# Patient Record
Sex: Female | Born: 1965 | Race: White | Hispanic: No | Smoking: Former smoker
Health system: Southern US, Community
[De-identification: ages and names within clinical notes are randomized; demographics above are authoritative.]

## PROBLEM LIST (undated history)

## (undated) DIAGNOSIS — K219 Gastro-esophageal reflux disease without esophagitis: Secondary | ICD-10-CM

## (undated) DIAGNOSIS — D219 Benign neoplasm of connective and other soft tissue, unspecified: Secondary | ICD-10-CM

## (undated) DIAGNOSIS — F909 Attention-deficit hyperactivity disorder, unspecified type: Secondary | ICD-10-CM

## (undated) DIAGNOSIS — Z9889 Other specified postprocedural states: Secondary | ICD-10-CM

## (undated) DIAGNOSIS — K210 Gastro-esophageal reflux disease with esophagitis: Secondary | ICD-10-CM

## (undated) DIAGNOSIS — K5790 Diverticulosis of intestine, part unspecified, without perforation or abscess without bleeding: Secondary | ICD-10-CM

## (undated) DIAGNOSIS — R51 Headache: Secondary | ICD-10-CM

## (undated) DIAGNOSIS — E785 Hyperlipidemia, unspecified: Secondary | ICD-10-CM

## (undated) DIAGNOSIS — R112 Nausea with vomiting, unspecified: Secondary | ICD-10-CM

## (undated) DIAGNOSIS — Z86018 Personal history of other benign neoplasm: Secondary | ICD-10-CM

## (undated) DIAGNOSIS — G43909 Migraine, unspecified, not intractable, without status migrainosus: Secondary | ICD-10-CM

## (undated) DIAGNOSIS — R519 Headache, unspecified: Secondary | ICD-10-CM

## (undated) DIAGNOSIS — Z8489 Family history of other specified conditions: Secondary | ICD-10-CM

## (undated) DIAGNOSIS — K449 Diaphragmatic hernia without obstruction or gangrene: Secondary | ICD-10-CM

## (undated) DIAGNOSIS — Z973 Presence of spectacles and contact lenses: Secondary | ICD-10-CM

## (undated) HISTORY — DX: Diverticulosis of intestine, part unspecified, without perforation or abscess without bleeding: K57.90

## (undated) HISTORY — PX: UPPER GI ENDOSCOPY: SHX6162

## (undated) HISTORY — PX: COLONOSCOPY: SHX174

## (undated) HISTORY — PX: WISDOM TOOTH EXTRACTION: SHX21

## (undated) HISTORY — DX: Attention-deficit hyperactivity disorder, unspecified type: F90.9

## (undated) HISTORY — DX: Benign neoplasm of connective and other soft tissue, unspecified: D21.9

## (undated) HISTORY — DX: Gastro-esophageal reflux disease with esophagitis: K21.0

## (undated) HISTORY — PX: DENTAL SURGERY: SHX609

## (undated) HISTORY — DX: Other specified postprocedural states: R11.2

---

## 2016-10-27 DIAGNOSIS — D219 Benign neoplasm of connective and other soft tissue, unspecified: Secondary | ICD-10-CM

## 2016-10-27 DIAGNOSIS — K2100 Gastro-esophageal reflux disease with esophagitis, without bleeding: Secondary | ICD-10-CM

## 2016-10-27 DIAGNOSIS — K5790 Diverticulosis of intestine, part unspecified, without perforation or abscess without bleeding: Secondary | ICD-10-CM

## 2016-10-27 HISTORY — DX: Benign neoplasm of connective and other soft tissue, unspecified: D21.9

## 2016-10-27 HISTORY — DX: Diverticulosis of intestine, part unspecified, without perforation or abscess without bleeding: K57.90

## 2016-10-27 HISTORY — DX: Gastro-esophageal reflux disease with esophagitis, without bleeding: K21.00

## 2017-02-23 ENCOUNTER — Ambulatory Visit: Payer: Self-pay | Admitting: Adult Health

## 2017-03-11 ENCOUNTER — Encounter: Payer: Self-pay | Admitting: Adult Health

## 2017-03-11 ENCOUNTER — Ambulatory Visit (INDEPENDENT_AMBULATORY_CARE_PROVIDER_SITE_OTHER): Payer: BC Managed Care – PPO | Admitting: Adult Health

## 2017-03-11 VITALS — BP 126/80 | HR 92 | Temp 98.1°F | Ht 63.0 in | Wt 173.0 lb

## 2017-03-11 DIAGNOSIS — R41840 Attention and concentration deficit: Secondary | ICD-10-CM

## 2017-03-11 DIAGNOSIS — Z0000 Encounter for general adult medical examination without abnormal findings: Secondary | ICD-10-CM | POA: Diagnosis not present

## 2017-03-11 NOTE — Assessment & Plan Note (Signed)
Referral to psychiatry placed.

## 2017-03-11 NOTE — Progress Notes (Signed)
Subjective:    Patient ID: Rachel Woods, female    DOB: 10-25-1966, 51 y.o.   MRN: [id number]  HPI:  Rachel Woods is here to establish as a new pt.  She is a very pleasant 51 year old female.  PMH:  Childhood ADHD treated with Ritalin, last dose was 1978.  She reports poor concentration and dis-organization and would like to be evaluated for ADD/ADHD. Only other complaint is "clear watery drainage form my belly button" that occurred during her last menstrual cycle.  This has never occurred before and has completely resolved now.  She denies abdominal pain/malaise/poor appetite/unexplained wt loss.   She has not had any preventative screening measures. Overall she feels "pretty great and longevity runs in my family".  No care team member to display  Patient Active Problem List   Diagnosis Date Noted  . Health care maintenance 03/11/2017     Past Medical History:  Diagnosis Date  . ADHD      Past Surgical History:  Procedure Laterality Date  . WISDOM TOOTH EXTRACTION       Family History  Problem Relation Age of Onset  . Hypertension Mother   . Dementia Mother   . Fibroids Mother   . Heart disease Mother   . Glaucoma Father   . Diabetes Father   . Hypertension Father   . Heart disease Father      History  Drug Use No     History  Alcohol Use  . Yes     History  Smoking Status  . Current Some Day Smoker  Smokeless Tobacco  . Never Used     Outpatient Encounter Prescriptions as of 03/11/2017  Medication Sig  . Aspirin-Acetaminophen-Caffeine (EXCEDRIN PO) Take by mouth as needed.  Marland Kitchen ibuprofen (ADVIL,MOTRIN) 200 MG tablet Take 200 mg by mouth as needed.   No facility-administered encounter medications on file as of 03/11/2017.     Allergies: Patient has no known allergies.  Body mass index is 30.65 kg/m.  Blood pressure 126/80, pulse 92, temperature 98.1 F (36.7 C), height 5\' 3"  (1.6 m), weight 173 lb (78.5 kg), last menstrual period  02/27/2017, SpO2 98 %.    No care team member to display  Patient Active Problem List   Diagnosis Date Noted  . Health care maintenance 03/11/2017     Past Medical History:  Diagnosis Date  . ADHD      Past Surgical History:  Procedure Laterality Date  . WISDOM TOOTH EXTRACTION       Family History  Problem Relation Age of Onset  . Hypertension Mother   . Dementia Mother   . Fibroids Mother   . Heart disease Mother   . Glaucoma Father   . Diabetes Father   . Hypertension Father   . Heart disease Father      History  Drug Use No     History  Alcohol Use  . Yes     History  Smoking Status  . Current Some Day Smoker  Smokeless Tobacco  . Never Used     Outpatient Encounter Prescriptions as of 03/11/2017  Medication Sig  . Aspirin-Acetaminophen-Caffeine (EXCEDRIN PO) Take by mouth as needed.  Marland Kitchen ibuprofen (ADVIL,MOTRIN) 200 MG tablet Take 200 mg by mouth as needed.   No facility-administered encounter medications on file as of 03/11/2017.     Allergies: Patient has no known allergies.  Body mass index is 30.65 kg/m.  Height 5\' 3"  (1.6 m), weight 173 lb (  78.5 kg), last menstrual period 02/27/2017.    Review of Systems  Constitutional: Positive for fatigue. Negative for activity change, appetite change, chills, diaphoresis, fever and unexpected weight change.  Respiratory: Negative for cough, chest tightness, shortness of breath, wheezing and stridor.   Cardiovascular: Negative for chest pain, palpitations and leg swelling.  Gastrointestinal: Negative for abdominal distention, abdominal pain, blood in stool, constipation, diarrhea, nausea and vomiting.  Endocrine: Negative for cold intolerance, heat intolerance, polydipsia, polyphagia and polyuria.  Genitourinary: Negative for difficulty urinating, flank pain and hematuria.  Skin: Negative for color change, pallor, rash and wound.  Allergic/Immunologic: Negative for immunocompromised state.   Neurological: Negative for dizziness.  Hematological: Does not bruise/bleed easily.  Psychiatric/Behavioral: Negative for agitation.       Objective:   Physical Exam  Constitutional: She is oriented to person, place, and time. She appears well-developed and well-nourished. No distress.  HENT:  Head: Normocephalic and atraumatic.  Right Ear: Hearing, tympanic membrane, external ear and ear canal normal. No decreased hearing is noted.  Left Ear: Hearing, tympanic membrane, external ear and ear canal normal. No decreased hearing is noted.  Nose: Nose normal. Right sinus exhibits no maxillary sinus tenderness and no frontal sinus tenderness. Left sinus exhibits no maxillary sinus tenderness and no frontal sinus tenderness.  Mouth/Throat: Uvula is midline.  Eyes: Conjunctivae are normal. Pupils are equal, round, and reactive to light.  Neck: Normal range of motion. Neck supple.  Cardiovascular: Normal rate, regular rhythm, normal heart sounds and intact distal pulses.   Pulmonary/Chest: Effort normal and breath sounds normal. No respiratory distress. She has no wheezes. She has no rales. She exhibits no tenderness.  Abdominal: Soft. Bowel sounds are normal. She exhibits no distension and no mass. There is no tenderness. There is no rebound and no guarding.  She refused inspection of abdomen and umbilicus during PE. Palpation was unremarkable.  Musculoskeletal: Normal range of motion.  Lymphadenopathy:    She has no cervical adenopathy.  Neurological: She is alert and oriented to person, place, and time. Coordination normal.  Skin: Skin is warm and dry. No rash noted. She is not diaphoretic. No erythema. No pallor.  Psychiatric: She has a normal mood and affect. Her behavior is normal. Judgment and thought content normal.  Nursing note and vitals reviewed.         Assessment & Plan:   1. Poor concentration   2. Health care maintenance     Health care maintenance Drink at least  80 ounces of water daily. Increase regular movement to at least 46mins/5 times a week. Follow Heart Healthy Diet. Psychiatry referral placed for ADHD evaluation and possible treatment. Please schedule CPE with fasting labs ASAP. Requires ALL preventative health screening, will address ASAP.  Poor concentration Referral to psychiatry placed.    FOLLOW-UP:  No Follow-up on file.

## 2017-03-11 NOTE — Patient Instructions (Addendum)
Heart-Healthy Eating Plan Many factors influence your heart health, including eating and exercise habits. Heart (coronary) risk increases with abnormal blood fat (lipid) levels. Heart-healthy meal planning includes limiting unhealthy fats, increasing healthy fats, and making other small dietary changes. This includes maintaining a healthy body weight to help keep lipid levels within a normal range. What is my plan? Your health care provider recommends that you:  Get no more than _________% of the total calories in your daily diet from fat.  Limit your intake of saturated fat to less than _________% of your total calories each day.  Limit the amount of cholesterol in your diet to less than _________ mg per day. What types of fat should I choose?  Choose healthy fats more often. Choose monounsaturated and polyunsaturated fats, such as olive oil and canola oil, flaxseeds, walnuts, almonds, and seeds.  Eat more omega-3 fats. Good choices include salmon, mackerel, sardines, tuna, flaxseed oil, and ground flaxseeds. Aim to eat fish at least two times each week.  Limit saturated fats. Saturated fats are primarily found in animal products, such as meats, butter, and cream. Plant sources of saturated fats include palm oil, palm kernel oil, and coconut oil.  Avoid foods with partially hydrogenated oils in them. These contain trans fats. Examples of foods that contain trans fats are stick margarine, some tub margarines, cookies, crackers, and other baked goods. What general guidelines do I need to follow?  Check food labels carefully to identify foods with trans fats or high amounts of saturated fat.  Fill one half of your plate with vegetables and green salads. Eat 4-5 servings of vegetables per day. A serving of vegetables equals 1 cup of raw leafy vegetables,  cup of raw or cooked cut-up vegetables, or  cup of vegetable juice.  Fill one fourth of your plate with whole grains. Look for the word  "whole" as the first word in the ingredient list.  Fill one fourth of your plate with lean protein foods.  Eat 4-5 servings of fruit per day. A serving of fruit equals one medium whole fruit,  cup of dried fruit,  cup of fresh, frozen, or canned fruit, or  cup of 100% fruit juice.  Eat more foods that contain soluble fiber. Examples of foods that contain this type of fiber are apples, broccoli, carrots, beans, peas, and barley. Aim to get 20-30 g of fiber per day.  Eat more home-cooked food and less restaurant, buffet, and fast food.  Limit or avoid alcohol.  Limit foods that are high in starch and sugar.  Avoid fried foods.  Cook foods by using methods other than frying. Baking, boiling, grilling, and broiling are all great options. Other fat-reducing suggestions include:  Removing the skin from poultry.  Removing all visible fats from meats.  Skimming the fat off of stews, soups, and gravies before serving them.  Steaming vegetables in water or broth.  Lose weight if you are overweight. Losing just 5-10% of your initial body weight can help your overall health and prevent diseases such as diabetes and heart disease.  Increase your consumption of nuts, legumes, and seeds to 4-5 servings per week. One serving of dried beans or legumes equals  cup after being cooked, one serving of nuts equals 1 ounces, and one serving of seeds equals  ounce or 1 tablespoon.  You may need to monitor your salt (sodium) intake, especially if you have high blood pressure. Talk with your health care provider or dietitian to get more  information about reducing sodium. What foods can I eat? Grains   Breads, including [place], white, pita, wheat, raisin, rye, oatmeal, and [place]. Tortillas that are neither fried nor made with lard or trans fat. Low-fat rolls, including hotdog and hamburger buns and English muffins. Biscuits. Muffins. Waffles. Pancakes. Light popcorn. Whole-grain cereals. Flatbread.  Melba toast. Pretzels. Breadsticks. Rusks. Low-fat snacks and crackers, including oyster, saltine, matzo, graham, animal, and rye. Rice and pasta, including [place] rice and those that are made with whole wheat. Vegetables  All vegetables. Fruits  All fruits, but limit coconut. Meats and Other Protein Sources  Lean, well-trimmed beef, veal, pork, and lamb. Chicken and [place] without skin. All fish and shellfish. Wild duck, rabbit, pheasant, and venison. Egg whites or low-cholesterol egg substitutes. Dried beans, peas, lentils, and tofu.Seeds and most nuts. Dairy  Low-fat or nonfat cheeses, including ricotta, string, and mozzarella. Skim or 1% milk that is liquid, powdered, or evaporated. Buttermilk that is made with low-fat milk. Nonfat or low-fat yogurt. Beverages  Mineral water. Diet carbonated beverages. Sweets and Desserts  Sherbets and fruit ices. Honey, jam, marmalade, jelly, and syrups. Meringues and gelatins. Pure sugar candy, such as hard candy, jelly beans, gumdrops, mints, marshmallows, and small amounts of dark chocolate. [employer] Eat all sweets and desserts in moderation. Fats and Oils  Nonhydrogenated (trans-free) margarines. Vegetable oils, including soybean, sesame, sunflower, olive, peanut, safflower, corn, canola, and cottonseed. Salad dressings or mayonnaise that are made with a vegetable oil. Limit added fats and oils that you use for cooking, baking, salads, and as spreads. Other  Cocoa powder. Coffee and tea. All seasonings and condiments. The items listed above may not be a complete list of recommended foods or beverages. Contact your dietitian for more options.  What foods are not recommended? Grains  Breads that are made with saturated or trans fats, oils, or whole milk. Croissants. Butter rolls. Cheese breads. Sweet rolls. Donuts. Buttered popcorn. Chow mein noodles. High-fat crackers, such as cheese or butter crackers. Meats and Other Protein Sources  Fatty  meats, such as hotdogs, short ribs, sausage, spareribs, bacon, ribeye roast or steak, and mutton. High-fat deli meats, such as salami and bologna. Caviar. Domestic duck and goose. Organ meats, such as kidney, liver, sweetbreads, brains, gizzard, chitterlings, and heart. Dairy  Cream, sour cream, cream cheese, and creamed cottage cheese. Whole milk cheeses, including blue (bleu), Monterey Jack, [place], [place], American, [place], Swiss, [place], [place], and [place]. Whole or 2% milk that is liquid, evaporated, or condensed. Whole buttermilk. Cream sauce or high-fat cheese sauce. Yogurt that is made from whole milk. Beverages  Regular sodas and drinks with added sugar. Sweets and Desserts  Frosting. Pudding. Cookies. Cakes other than angel food cake. Candy that has milk chocolate or white chocolate, hydrogenated fat, butter, coconut, or unknown ingredients. Buttered syrups. Full-fat ice cream or ice cream drinks. Fats and Oils  Gravy that has suet, meat fat, or shortening. Cocoa butter, hydrogenated oils, palm oil, coconut oil, palm kernel oil. These can often be found in baked products, candy, fried foods, nondairy creamers, and whipped toppings. Solid fats and shortenings, including bacon fat, salt pork, lard, and butter. Nondairy cream substitutes, such as coffee creamers and sour cream substitutes. Salad dressings that are made of unknown oils, cheese, or sour cream. The items listed above may not be a complete list of foods and beverages to avoid. Contact your dietitian for more information.  This information is not intended to replace advice given to you by  your health care provider. Make sure you discuss any questions you have with your health care provider. Document Released: 07/22/2008 Document Revised: 05/02/2016 Document Reviewed: 04/06/2014 Elsevier Interactive Patient Education  2017 [employer].  Psychiatry referral placed. Please schedule complete physical with fasting labs at your  convenience.  NICE TO MEET YOU.

## 2017-03-11 NOTE — Assessment & Plan Note (Signed)
Drink at least 80 ounces of water daily. Increase regular movement to at least 70mins/5 times a week. Follow Heart Healthy Diet. Psychiatry referral placed for ADHD evaluation and possible treatment. Please schedule CPE with fasting labs ASAP. Requires ALL preventative health screening, will address ASAP.

## 2017-04-06 ENCOUNTER — Other Ambulatory Visit: Payer: Self-pay

## 2017-04-06 DIAGNOSIS — Z0000 Encounter for general adult medical examination without abnormal findings: Secondary | ICD-10-CM

## 2017-04-09 ENCOUNTER — Ambulatory Visit (INDEPENDENT_AMBULATORY_CARE_PROVIDER_SITE_OTHER): Payer: BC Managed Care – PPO | Admitting: Adult Health

## 2017-04-09 VITALS — BP 136/81 | HR 109 | Ht 63.0 in | Wt 170.2 lb

## 2017-04-09 DIAGNOSIS — Z0000 Encounter for general adult medical examination without abnormal findings: Secondary | ICD-10-CM | POA: Diagnosis not present

## 2017-04-09 DIAGNOSIS — R41840 Attention and concentration deficit: Secondary | ICD-10-CM | POA: Diagnosis not present

## 2017-04-09 NOTE — Progress Notes (Signed)
Subjective:    Patient ID: Rachel Woods, female    DOB: 1966-08-09, 51 y.o.   MRN: [id number]  HPI:  Ms. Brenner presents for CPE with fasting labs today.  She declines PAP/female examination today.  Overall she feels well, just continues to struggle with "poor concentration and jumping all over the place".  She reports taking ADHD medication > 10 years ago and is interested in referral to psychiatry for evaluation/re-starting medication. 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  She has fatigue, despite excellent sleep hygiene and getting > 9 hrs of rest/night.  Patient Care Team    Relationship Specialty Notifications Start End  Esaw Grandchild, NP PCP - General Family Medicine  03/11/17     Patient Active Problem List   Diagnosis Date Noted  . Health care maintenance 03/11/2017  . Poor concentration 03/11/2017     Past Medical History:  Diagnosis Date  . ADHD      Past Surgical History:  Procedure Laterality Date  . WISDOM TOOTH EXTRACTION       Family History  Problem Relation Age of Onset  . Hypertension Mother   . Dementia Mother   . Fibroids Mother   . Heart disease Mother   . Glaucoma Father   . Diabetes Father   . Hypertension Father   . Heart disease Father      History  Drug Use No     History  Alcohol Use  . Yes     History  Smoking Status  . Current Some Day Smoker  Smokeless Tobacco  . Never Used     Outpatient Encounter Prescriptions as of 04/09/2017  Medication Sig  . Aspirin-Acetaminophen-Caffeine (EXCEDRIN PO) Take by mouth as needed.  Marland Kitchen ibuprofen (ADVIL,MOTRIN) 200 MG tablet Take 200 mg by mouth as needed.   No facility-administered encounter medications on file as of 04/09/2017.     Allergies: Patient has no known allergies.  Body mass index is 30.15 kg/m.  Blood pressure 136/81, pulse (!) 109, height 5\' 3"  (1.6 m), weight 170 lb 3.2 oz (77.2 kg).     Review of Systems   Constitutional: Positive for fatigue. Negative for activity change, appetite change, chills, diaphoresis, fever and unexpected weight change.  Respiratory: Negative for cough, chest tightness, shortness of breath, wheezing and stridor.   Cardiovascular: Negative for chest pain, palpitations and leg swelling.  Gastrointestinal: Negative for abdominal distention, abdominal pain, blood in stool, constipation, diarrhea, nausea and vomiting.  Endocrine: Negative for cold intolerance, heat intolerance, polydipsia, polyphagia and polyuria.  Genitourinary: Negative for difficulty urinating, flank pain and hematuria.  Musculoskeletal: Negative for arthralgias, back pain, gait problem, joint swelling, myalgias, neck pain and neck stiffness.  Skin: Negative for color change, pallor, rash and wound.  Allergic/Immunologic: Negative for immunocompromised state.  Neurological: Negative for dizziness and headaches.  Hematological: Does not bruise/bleed easily.  Psychiatric/Behavioral: Positive for decreased concentration. Negative for agitation, behavioral problems, confusion, dysphoric mood and sleep disturbance. The patient is not nervous/anxious.        Objective:   Physical Exam  Constitutional: She is oriented to person, place, and time. She appears well-developed and well-nourished. No distress.  HENT:  Head: Normocephalic and atraumatic.  Right Ear: Hearing, tympanic membrane, external ear and ear canal normal. No decreased hearing is noted.  Left Ear: Hearing, tympanic membrane, external ear and ear canal normal. No decreased hearing is noted.  Nose: Nose normal. Right sinus exhibits  no maxillary sinus tenderness and no frontal sinus tenderness. Left sinus exhibits no maxillary sinus tenderness and no frontal sinus tenderness.  Mouth/Throat: Uvula is midline.  Eyes: Conjunctivae are normal. Pupils are equal, round, and reactive to light.  Neck: Normal range of motion. Neck supple.   Cardiovascular: Normal rate, regular rhythm, normal heart sounds and intact distal pulses.   No murmur heard. Pulmonary/Chest: Breath sounds normal. No respiratory distress. She has no wheezes. She has no rales. She exhibits no tenderness.  Abdominal: Soft. Bowel sounds are normal. She exhibits no distension and no mass. There is no tenderness. There is no rebound and no guarding.  Musculoskeletal: Normal range of motion.  Lymphadenopathy:    She has no cervical adenopathy.  Neurological: She is alert and oriented to person, place, and time. Coordination normal.  Skin: Skin is warm and dry. No rash noted. She is not diaphoretic. No erythema. No pallor.  Psychiatric: She has a normal mood and affect. Her behavior is normal. Judgment and thought content normal.  Nursing note and vitals reviewed.         Assessment & Plan:   1. Poor concentration   2. Health care maintenance     Health care maintenance CPE with fasting labs completed today-will call when results are available. Increase water intake to at least 80 ounces/daily. Increase regular movement to at least 59mins/5 times week. Annual follow-up, sooner if needed.  Poor concentration Hx of ADD/ADHD Psychiatry referral placed.    FOLLOW-UP:  Return in about 1 year (around 04/09/2018) for Regular Follow Up.

## 2017-04-09 NOTE — Patient Instructions (Signed)
Exercising to Lose Weight Exercising can help you to lose weight. In order to lose weight through exercise, you need to do vigorous-intensity exercise. You can tell that you are exercising with vigorous intensity if you are breathing very hard and fast and cannot hold a conversation while exercising. Moderate-intensity exercise helps to maintain your current weight. You can tell that you are exercising at a moderate level if you have a higher heart rate and faster breathing, but you are still able to hold a conversation. How often should I exercise? Choose an activity that you enjoy and set realistic goals. Your health care provider can help you to make an activity plan that works for you. Exercise regularly as directed by your health care provider. This may include:  Doing resistance training twice each week, such as: ? Push-ups. ? Sit-ups. ? Lifting weights. ? Using resistance bands.  Doing a given intensity of exercise for a given amount of time. Choose from these options: ? 150 minutes of moderate-intensity exercise every week. ? 75 minutes of vigorous-intensity exercise every week. ? A mix of moderate-intensity and vigorous-intensity exercise every week.  Children, pregnant women, people who are out of shape, people who are overweight, and older adults may need to consult a health care provider for individual recommendations. If you have any sort of medical condition, be sure to consult your health care provider before starting a new exercise program. What are some activities that can help me to lose weight?  Walking at a rate of at least 4.5 miles an hour.  Jogging or running at a rate of 5 miles per hour.  Biking at a rate of at least 10 miles per hour.  Lap swimming.  Roller-skating or in-line skating.  Cross-country skiing.  Vigorous competitive sports, such as football, basketball, and soccer.  Jumping rope.  Aerobic dancing. How can I be more active in my day-to-day  activities?  Use the stairs instead of the elevator.  Take a walk during your lunch break.  If you drive, park your car farther away from work or school.  If you take public transportation, get off one stop early and walk the rest of the way.  Make all of your phone calls while standing up and walking around.  Get up, stretch, and walk around every 30 minutes throughout the day. What guidelines should I follow while exercising?  Do not exercise so much that you hurt yourself, feel dizzy, or get very short of breath.  Consult your health care provider prior to starting a new exercise program.  Wear comfortable clothes and shoes with good support.  Drink plenty of water while you exercise to prevent dehydration or heat stroke. Body water is lost during exercise and must be replaced.  Work out until you breathe faster and your heart beats faster. This information is not intended to replace advice given to you by your health care provider. Make sure you discuss any questions you have with your health care provider. Document Released: 11/15/2010 Document Revised: 03/20/2016 Document Reviewed: 03/16/2014 Elsevier Interactive Patient Education  2018 [employer]. Heart-Healthy Eating Plan Many factors influence your heart health, including eating and exercise habits. Heart (coronary) risk increases with abnormal blood fat (lipid) levels. Heart-healthy meal planning includes limiting unhealthy fats, increasing healthy fats, and making other small dietary changes. This includes maintaining a healthy body weight to help keep lipid levels within a normal range. What is my plan? Your health care provider recommends that you:  Get no  more than _________% of the total calories in your daily diet from fat.  Limit your intake of saturated fat to less than _________% of your total calories each day.  Limit the amount of cholesterol in your diet to less than _________ mg per day.  What types of  fat should I choose?  Choose healthy fats more often. Choose monounsaturated and polyunsaturated fats, such as olive oil and canola oil, flaxseeds, walnuts, almonds, and seeds.  Eat more omega-3 fats. Good choices include salmon, mackerel, sardines, tuna, flaxseed oil, and ground flaxseeds. Aim to eat fish at least two times each week.  Limit saturated fats. Saturated fats are primarily found in animal products, such as meats, butter, and cream. Plant sources of saturated fats include palm oil, palm kernel oil, and coconut oil.  Avoid foods with partially hydrogenated oils in them. These contain trans fats. Examples of foods that contain trans fats are stick margarine, some tub margarines, cookies, crackers, and other baked goods. What general guidelines do I need to follow?  Check food labels carefully to identify foods with trans fats or high amounts of saturated fat.  Fill one half of your plate with vegetables and green salads. Eat 4-5 servings of vegetables per day. A serving of vegetables equals 1 cup of raw leafy vegetables,  cup of raw or cooked cut-up vegetables, or  cup of vegetable juice.  Fill one fourth of your plate with whole grains. Look for the word "whole" as the first word in the ingredient list.  Fill one fourth of your plate with lean protein foods.  Eat 4-5 servings of fruit per day. A serving of fruit equals one medium whole fruit,  cup of dried fruit,  cup of fresh, frozen, or canned fruit, or  cup of 100% fruit juice.  Eat more foods that contain soluble fiber. Examples of foods that contain this type of fiber are apples, broccoli, carrots, beans, peas, and barley. Aim to get 20-30 g of fiber per day.  Eat more home-cooked food and less restaurant, buffet, and fast food.  Limit or avoid alcohol.  Limit foods that are high in starch and sugar.  Avoid fried foods.  Cook foods by using methods other than frying. Baking, boiling, grilling, and broiling are all  great options. Other fat-reducing suggestions include: ? Removing the skin from poultry. ? Removing all visible fats from meats. ? Skimming the fat off of stews, soups, and gravies before serving them. ? Steaming vegetables in water or broth.  Lose weight if you are overweight. Losing just 5-10% of your initial body weight can help your overall health and prevent diseases such as diabetes and heart disease.  Increase your consumption of nuts, legumes, and seeds to 4-5 servings per week. One serving of dried beans or legumes equals  cup after being cooked, one serving of nuts equals 1 ounces, and one serving of seeds equals  ounce or 1 tablespoon.  You may need to monitor your salt (sodium) intake, especially if you have high blood pressure. Talk with your health care provider or dietitian to get more information about reducing sodium. What foods can I eat? Grains  Breads, including [place], white, pita, wheat, raisin, rye, oatmeal, and [place]. Tortillas that are neither fried nor made with lard or trans fat. Low-fat rolls, including hotdog and hamburger buns and English muffins. Biscuits. Muffins. Waffles. Pancakes. Light popcorn. Whole-grain cereals. Flatbread. Melba toast. Pretzels. Breadsticks. Rusks. Low-fat snacks and crackers, including oyster, saltine, matzo, graham, animal,  and rye. Rice and pasta, including brown rice and those that are made with whole wheat. Vegetables All vegetables. Fruits All fruits, but limit coconut. Meats and Other Protein Sources Lean, well-trimmed beef, veal, pork, and lamb. Chicken and [place] without skin. All fish and shellfish. Wild duck, rabbit, pheasant, and venison. Egg whites or low-cholesterol egg substitutes. Dried beans, peas, lentils, and tofu.Seeds and most nuts. Dairy Low-fat or nonfat cheeses, including ricotta, string, and mozzarella. Skim or 1% milk that is liquid, powdered, or evaporated. Buttermilk that is made with low-fat milk. Nonfat  or low-fat yogurt. Beverages Mineral water. Diet carbonated beverages. Sweets and Desserts Sherbets and fruit ices. Honey, jam, marmalade, jelly, and syrups. Meringues and gelatins. Pure sugar candy, such as hard candy, jelly beans, gumdrops, mints, marshmallows, and small amounts of dark chocolate. [employer] Eat all sweets and desserts in moderation. Fats and Oils Nonhydrogenated (trans-free) margarines. Vegetable oils, including soybean, sesame, sunflower, olive, peanut, safflower, corn, canola, and cottonseed. Salad dressings or mayonnaise that are made with a vegetable oil. Limit added fats and oils that you use for cooking, baking, salads, and as spreads. Other Cocoa powder. Coffee and tea. All seasonings and condiments. The items listed above may not be a complete list of recommended foods or beverages. Contact your dietitian for more options. What foods are not recommended? Grains Breads that are made with saturated or trans fats, oils, or whole milk. Croissants. Butter rolls. Cheese breads. Sweet rolls. Donuts. Buttered popcorn. Chow mein noodles. High-fat crackers, such as cheese or butter crackers. Meats and Other Protein Sources Fatty meats, such as hotdogs, short ribs, sausage, spareribs, bacon, ribeye roast or steak, and mutton. High-fat deli meats, such as salami and bologna. Caviar. Domestic duck and goose. Organ meats, such as kidney, liver, sweetbreads, brains, gizzard, chitterlings, and heart. Dairy Cream, sour cream, cream cheese, and creamed cottage cheese. Whole milk cheeses, including blue (bleu), Monte[place] Jack, [place], [place], [place], [place], [place], [place], [place], and [place]. Whole or 2% milk that is liquid, evaporated, or condensed. Whole buttermilk. Cream sauce or high-fat cheese sauce. Yogurt that is made from whole milk. Beverages Regular sodas and drinks with added sugar. Sweets and Desserts Frosting. Pudding. Cookies. Cakes other than angel food cake.  Candy that has milk chocolate or white chocolate, hydrogenated fat, butter, coconut, or unknown ingredients. Buttered syrups. Full-fat ice cream or ice cream drinks. Fats and Oils Gravy that has suet, meat fat, or shortening. Cocoa butter, hydrogenated oils, palm oil, coconut oil, palm kernel oil. These can often be found in baked products, candy, fried foods, nondairy creamers, and whipped toppings. Solid fats and shortenings, including bacon fat, salt pork, lard, and butter. Nondairy cream substitutes, such as coffee creamers and sour cream substitutes. Salad dressings that are made of unknown oils, cheese, or sour cream. The items listed above may not be a complete list of foods and beverages to avoid. Contact your dietitian for more information. This information is not intended to replace advice given to you by your health care provider. Make sure you discuss any questions you have with your health care provider. Document Released: 07/22/2008 Document Revised: 05/02/2016 Document Reviewed: 04/06/2014 Elsevier Interactive Patient Education  2017 Rachel Woods.  Increase water intake to at least 80 ounces/day. Increase regular movement to at least 57mins/5 times a week. Will call when lab results are available. Psychiatry referral placed. Annual follow-up, sooner if needed.

## 2017-04-09 NOTE — Assessment & Plan Note (Signed)
CPE with fasting labs completed today-will call when results are available. Increase water intake to at least 80 ounces/daily. Increase regular movement to at least 74mins/5 times week. Annual follow-up, sooner if needed.

## 2017-04-09 NOTE — Assessment & Plan Note (Signed)
Hx of ADD/ADHD Psychiatry referral placed.

## 2017-04-10 LAB — COMPREHENSIVE METABOLIC PANEL
A/G RATIO: 1.5 (ref 1.2–2.2)
ALBUMIN: 4.3 g/dL (ref 3.5–5.5)
ALK PHOS: 57 IU/L (ref 39–117)
ALT: 20 IU/L (ref 0–32)
AST: 15 IU/L (ref 0–40)
BILIRUBIN TOTAL: 0.9 mg/dL (ref 0.0–1.2)
BUN / CREAT RATIO: 14 (ref 9–23)
BUN: 11 mg/dL (ref 6–24)
CHLORIDE: 101 mmol/L (ref 96–106)
CO2: 25 mmol/L (ref 20–29)
Calcium: 9.5 mg/dL (ref 8.7–10.2)
Creatinine, Ser: 0.78 mg/dL (ref 0.57–1.00)
GFR calc non Af Amer: 90 mL/min/{1.73_m2} (ref >59)
GFR, EST AFRICAN AMERICAN: 103 mL/min/{1.73_m2} (ref >59)
Globulin, Total: 2.9 g/dL (ref 1.5–4.5)
Glucose: 91 mg/dL (ref 65–99)
POTASSIUM: 4.6 mmol/L (ref 3.5–5.2)
SODIUM: 141 mmol/L (ref 134–144)
TOTAL PROTEIN: 7.2 g/dL (ref 6.0–8.5)

## 2017-04-10 LAB — CBC WITH DIFFERENTIAL/PLATELET
BASOS: 1 %
Basophils Absolute: 0 10*3/uL (ref 0.0–0.2)
EOS (ABSOLUTE): 0.1 10*3/uL (ref 0.0–0.4)
Eos: 2 %
Hematocrit: 42.6 % (ref 34.0–46.6)
Hemoglobin: 14.1 g/dL (ref 11.1–15.9)
Immature Grans (Abs): 0 10*3/uL (ref 0.0–0.1)
Immature Granulocytes: 1 %
Lymphocytes Absolute: 1.7 10*3/uL (ref 0.7–3.1)
Lymphs: 25 %
MCH: 27.1 pg (ref 26.6–33.0)
MCHC: 33.1 g/dL (ref 31.5–35.7)
MCV: 82 fL (ref 79–97)
MONOS ABS: 0.5 10*3/uL (ref 0.1–0.9)
Monocytes: 7 %
NEUTROS PCT: 64 %
Neutrophils Absolute: 4.3 10*3/uL (ref 1.4–7.0)
PLATELETS: 350 10*3/uL (ref 150–379)
RBC: 5.21 x10E6/uL (ref 3.77–5.28)
RDW: 14.7 % (ref 12.3–15.4)
WBC: 6.6 10*3/uL (ref 3.4–10.8)

## 2017-04-10 LAB — LIPID PANEL
CHOL/HDL RATIO: 4.5 ratio — AB (ref 0.0–4.4)
Cholesterol, Total: 177 mg/dL (ref 100–199)
HDL: 39 mg/dL — ABNORMAL LOW (ref >39)
LDL CALC: 111 mg/dL — AB (ref 0–99)
Triglycerides: 134 mg/dL (ref 0–149)
VLDL Cholesterol Cal: 27 mg/dL (ref 5–40)

## 2017-04-10 LAB — VITAMIN D 25 HYDROXY (VIT D DEFICIENCY, FRACTURES): VIT D 25 HYDROXY: 24.5 ng/mL — AB (ref 30.0–100.0)

## 2017-04-10 LAB — HEMOGLOBIN A1C
Est. average glucose Bld gHb Est-mCnc: 111 mg/dL
Hgb A1c MFr Bld: 5.5 % (ref 4.8–5.6)

## 2017-04-10 LAB — TSH: TSH: 1.45 u[IU]/mL (ref 0.450–4.500)

## 2017-04-22 ENCOUNTER — Telehealth: Payer: Self-pay

## 2017-04-22 NOTE — Telephone Encounter (Signed)
Exhausted all options to reach patient.  No working numbers.  Letter mailed.

## 2017-04-22 NOTE — Telephone Encounter (Signed)
-----   Message from Esaw Grandchild, NP sent at 04/20/2017 10:47 AM EDT ----- Good Morning, Can you please call Rachel Woods and share that labs WNL, exception: HDL 39, needs to be >39-increase regular movement. LDL 111 (normal <99)- reduce saturated fat intake-basically follow heart healthy diet (information provided at last OV). Vit d low at 24.5, recommend OTC Vit d 2000 IU, take daily. Everything else looks good, follow-up annually. Please ask is psychiatry has called to schedule her initial appt? Thanks! Valetta Fuller

## 2017-05-04 ENCOUNTER — Telehealth: Payer: Self-pay | Admitting: Adult Health

## 2017-05-04 NOTE — Telephone Encounter (Signed)
Pt called states she was sent a MyChart msg to contact the office for Lab results-- forwarding msg to MA to contact [place]--at corrected ph#[phone]. --glh

## 2017-05-05 NOTE — Telephone Encounter (Signed)
Left message for patient to call office with her questions.

## 2017-06-08 ENCOUNTER — Telehealth: Payer: Self-pay | Admitting: Obstetrics & Gynecology

## 2017-06-08 ENCOUNTER — Ambulatory Visit (INDEPENDENT_AMBULATORY_CARE_PROVIDER_SITE_OTHER): Payer: BC Managed Care – PPO | Admitting: Adult Health

## 2017-06-08 ENCOUNTER — Encounter: Payer: Self-pay | Admitting: Adult Health

## 2017-06-08 VITALS — BP 114/75 | HR 77 | Temp 98.2°F | Ht 63.0 in | Wt 172.0 lb

## 2017-06-08 DIAGNOSIS — Z0000 Encounter for general adult medical examination without abnormal findings: Secondary | ICD-10-CM | POA: Diagnosis not present

## 2017-06-08 DIAGNOSIS — Z30014 Encounter for initial prescription of intrauterine contraceptive device: Secondary | ICD-10-CM | POA: Diagnosis not present

## 2017-06-08 DIAGNOSIS — R198 Other specified symptoms and signs involving the digestive system and abdomen: Secondary | ICD-10-CM | POA: Insufficient documentation

## 2017-06-08 NOTE — Patient Instructions (Signed)
Abdominal or Pelvic Ultrasound An ultrasound is a test that uses sound waves to take pictures of the inside of the body. An abdominal ultrasound takes pictures of the inside of the abdomen, and a pelvic ultrasound takes pictures of the inside of the pelvis. An abdominal or pelvic ultrasound may be done:  To provide information about a baby developing in the womb, such as the baby's position and heartbeat.  To check the shape or size of an organ.  To check for problems such as: ? Cysts. ? Masses. ? Inflammation. ? Kidney stones. ? Gallstones.  Tell a health care provider about:  Any allergies you have.  All medicines you are taking, including vitamins, herbs, eye drops, creams, and over-the-counter medicines.  Previous surgeries you have had.  Any medical conditions you have.  Whether you are pregnant or may be pregnant. What are the risks? There are no known risks or complications from having this test. What happens before the procedure?  Follow instructions from your health care provider about eating or drinking restrictions.  Wear clothing that is easily washable in case the gel used for the test gets on your clothes. What happens during the procedure?  A gel will be applied to your skin. It may feel cool.  A wand-like device called a transducer will be placed on the area to be examined.  The transducer will take pictures. These will be displayed on one or more monitors that look like small TV screens. What happens after the procedure?  It is your responsibility to get your test results. Ask your health care provider or the department performing the test when your results will be ready.  Keep follow-up visits as told by your health care provider. This is important. This information is not intended to replace advice given to you by your health care provider. Make sure you discuss any questions you have with your health care provider. Document Released: 10/10/2000  Document Revised: 06/25/2016 Document Reviewed: 07/06/2015 Elsevier Interactive Patient Education  2018 [place] your belly button clean/dry.  Apply small amount of Neosporin to inside belly button once a day. Please call clinic with any "Red Flag" symptoms. We will call you when the results from abdominal ultrasound are available. NICE TO SEE YOU!

## 2017-06-08 NOTE — Progress Notes (Signed)
Subjective:    Patient ID: Rachel Woods, female    DOB: Oct 14, 1966, 51 y.o.   MRN: [id number]  HPI:  Rachel Woods is here for umbilical drainage that began last Thursday.  Drainage is clear and was heaviest on thurs/fri last week (she brought in her shirt from Friday which had approx 2 inch are with small, circular areas of clear/dried drainage).  She denies abdominal trauma/injury prior to onset of sx's.  She denies abdominal pain, however is experiencing lower abdominal/pelvic cramping r/t to her menstrual cycle that she started yesterday.  She denies fever/night sweats/chills/malaise/poor appetite.  She denies this ever occurring before.  She denies recent travel outside [place].  She denies in blood stool/urine, with exception of menstruation.   Patient Care Team    Relationship Specialty Notifications Start End  Rachel Grandchild, NP PCP - General Family Medicine  03/11/17     Patient Active Problem List   Diagnosis Date Noted  . Health care maintenance 03/11/2017  . Poor concentration 03/11/2017     Past Medical History:  Diagnosis Date  . ADHD      Past Surgical History:  Procedure Laterality Date  . DENTAL SURGERY    . WISDOM TOOTH EXTRACTION       Family History  Problem Relation Age of Onset  . Hypertension Mother   . Dementia Mother   . Fibroids Mother   . Heart disease Mother   . Glaucoma Father   . Diabetes Father   . Hypertension Father   . Heart disease Father      History  Drug Use No     History  Alcohol Use  . Yes     History  Smoking Status  . Current Some Day Smoker  Smokeless Tobacco  . Never Used     Outpatient Encounter Prescriptions as of 06/08/2017  Medication Sig  . Aspirin-Acetaminophen-Caffeine (EXCEDRIN PO) Take by mouth as needed.  . Cholecalciferol (VITAMIN D3) 5000 units CAPS Take 1 capsule by mouth daily.  Marland Kitchen ibuprofen (ADVIL,MOTRIN) 200 MG tablet Take 200 mg by mouth as needed.   No facility-administered encounter  medications on file as of 06/08/2017.     Allergies: Patient has no known allergies.  Body mass index is 30.47 kg/m.  Blood pressure 114/75, pulse 77, temperature 98.2 F (36.8 C), temperature source Oral, height 5\' 3"  (1.6 m), weight 172 lb (78 kg), last menstrual period 06/07/2017.   Review of Systems  Constitutional: Positive for fatigue. Negative for activity change, appetite change, chills, diaphoresis and fever.  Respiratory: Negative for cough, chest tightness, shortness of breath, wheezing and stridor.   Cardiovascular: Negative for chest pain and palpitations.  Gastrointestinal: Negative for abdominal distention, abdominal pain, anal bleeding, blood in stool, constipation, diarrhea, nausea, rectal pain and vomiting.       Clear drainage from umbilicus that started 12/28/17  Endocrine: Negative for cold intolerance, heat intolerance, polydipsia and polyphagia.  Genitourinary: Positive for difficulty urinating and pelvic pain. Negative for flank pain and hematuria.  Musculoskeletal: Negative for back pain.  Skin: Negative for color change, pallor, rash and wound.  Allergic/Immunologic: Negative for immunocompromised state.  Neurological: Negative for dizziness and weakness.  Hematological: Does not bruise/bleed easily.  Psychiatric/Behavioral: Positive for decreased concentration. Negative for confusion, self-injury and suicidal ideas. The patient is not nervous/anxious.        Objective:   Physical Exam  Constitutional: She appears well-developed and well-nourished. No distress.  HENT:  Head: Normocephalic and atraumatic.  Right Ear: External ear normal.  Left Ear: External ear normal.  Cardiovascular: Normal rate, regular rhythm, normal heart sounds and intact distal pulses.   No murmur heard. Pulmonary/Chest: Effort normal and breath sounds normal. No respiratory distress. She has no wheezes. She has no rales. She exhibits no tenderness.  Abdominal: Soft. Bowel sounds  are normal. She exhibits mass. She exhibits no distension. There is no hepatosplenomegaly. There is no tenderness. There is no rebound, no guarding, no CVA tenderness, no tenderness at McBurney's point and negative Murphy's sign.    Area of firmness/mass noted below umbilicus-RLQ/LLQ Palpation/pressing on/around umbilicus did not produce any drainage. One small pinpoint open area noted on inside L side of umbilicus. Area cleaned and small layer of Polysporin applied.  No excessive warmth/redness/streaking noted.    Musculoskeletal: Normal range of motion.  Neurological: She is alert.  Skin: Skin is warm and dry. No rash noted. She is not diaphoretic. No erythema. No pallor.  Psychiatric: She has a normal mood and affect. Her behavior is normal. Judgment normal.  Nursing note and vitals reviewed.         Assessment & Plan:   1. Abnormal abdominal exam   2. Health care maintenance   3. Encounter for initial prescription of intrauterine contraceptive device (IUD)     Health care maintenance OB/GYN referral placed for IUD placement.  Abnormal abdominal exam No umbilical drainage noted, however one/small pinpoint opening noted at top L of inside umbilicus. Generalized firmness noted just below umbilicus that the pt reports is new. US abdomen complete ordered. Red Flag sx's discussed and she will call clinic if any occur. Referral to GI/general surgeon if [place] results indicate it.     FOLLOW-UP:  Return if symptoms worsen or fail to improve.

## 2017-06-08 NOTE — Assessment & Plan Note (Signed)
No umbilical drainage noted, however one/small pinpoint opening noted at top L of inside umbilicus. Generalized firmness noted just below umbilicus that the pt reports is new. US abdomen complete ordered. Red Flag sx's discussed and she will call clinic if any occur. Referral to GI/general surgeon if [place] results indicate it.

## 2017-06-08 NOTE — Assessment & Plan Note (Signed)
OB/GYN referral placed for IUD placement.

## 2017-06-08 NOTE — Telephone Encounter (Signed)
Called and left a message for patient to call back to schedule a new patient doctor referral for consult IUD.

## 2017-06-11 NOTE — Telephone Encounter (Signed)
Called and left a message for patient to call back to schedule a new patient doctor referral. °

## 2017-06-12 ENCOUNTER — Ambulatory Visit
Admission: RE | Admit: 2017-06-12 | Discharge: 2017-06-12 | Disposition: A | Discharge status: Home / Self Care | Payer: BC Managed Care – PPO | Source: Ambulatory Visit | Attending: Adult Health | Admitting: Adult Health

## 2017-06-12 DIAGNOSIS — R198 Other specified symptoms and signs involving the digestive system and abdomen: Secondary | ICD-10-CM

## 2017-06-18 ENCOUNTER — Encounter: Payer: Self-pay | Admitting: Obstetrics and Gynecology

## 2017-06-18 ENCOUNTER — Other Ambulatory Visit (HOSPITAL_COMMUNITY)
Admission: RE | Admit: 2017-06-18 | Discharge: 2017-06-18 | Disposition: A | Discharge status: Home / Self Care | Payer: BC Managed Care – PPO | Source: Ambulatory Visit | Attending: Obstetrics and Gynecology | Admitting: Obstetrics and Gynecology

## 2017-06-18 ENCOUNTER — Ambulatory Visit (INDEPENDENT_AMBULATORY_CARE_PROVIDER_SITE_OTHER): Payer: BC Managed Care – PPO | Admitting: Obstetrics and Gynecology

## 2017-06-18 ENCOUNTER — Other Ambulatory Visit: Payer: Self-pay | Admitting: Obstetrics and Gynecology

## 2017-06-18 VITALS — BP 130/76 | HR 80 | Resp 18 | Ht 63.5 in | Wt 171.8 lb

## 2017-06-18 DIAGNOSIS — R1900 Intra-abdominal and pelvic swelling, mass and lump, unspecified site: Secondary | ICD-10-CM | POA: Diagnosis not present

## 2017-06-18 DIAGNOSIS — R198 Other specified symptoms and signs involving the digestive system and abdomen: Secondary | ICD-10-CM

## 2017-06-18 DIAGNOSIS — Z124 Encounter for screening for malignant neoplasm of cervix: Secondary | ICD-10-CM | POA: Insufficient documentation

## 2017-06-18 NOTE — Progress Notes (Signed)
51 y.o. G1P1 Divorced Caucasian female here for abnormal abdominal ultrasound revealing enlarged uterus. Patient complaining of drainage coming from navel area and abdominal pain also.   Has not seen a provider in 19 years.   No know hx of fibroids.   Menses are heavy and crampy.  Pad change every 1.5 - 2 hours. No intermenstrual bleeding.   Has pain with intercourse.   Feels a pulling feeling in her pelvis when she stands.  Clothing is more tight.  Early satiety.  Has constipation.  BMS every 3 - 4 days.  Some rectal bleeding during her menstruation.   Feels tired all the time.  Hgb 14.1 and TSH 1.45 on 04/09/17.   Has urinary urgency up to every 15 - 30 minutes.  Leaks with cough, laugh, running.   Declines future childbearing.   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 Son is at the university.   PCP:  Mina Marble, NP  Patient's last menstrual period was 06/07/2017 (exact date).     Period Cycle (Days): 30 Period Duration (Days): 2-3days Period Pattern: Regular Menstrual Flow:  (1st day heavy then tapers) Menstrual Control: Tampon, Maxi pad Menstrual Control Change Freq (Hours): every 2-3 hours Dysmenorrhea: (!) Severe Dysmenorrhea Symptoms: Cramping, Headache, Diarrhea, Nausea     Sexually active: Yes.   female The current method of family planning is none.    Exercising: No.   Smoker:  no  Health Maintenance: Pap:  Years ago History of abnormal Pap:  no MMG:  NEVER Colonoscopy:  NEVER BMD:   n/a  Result  n/a TDaP:  03/2015 Gardasil:   no WFU:XNAT pregnancy--Neg Hep C: with pregnancy--Neg Screening Labs:   ---   reports that she has been smoking.  She has never used smokeless tobacco. She reports that she drinks alcohol. She reports that she does not use drugs.  Past Medical History:  Diagnosis Date  . ADHD     Past Surgical History:  Procedure Laterality Date  . DENTAL SURGERY    . WISDOM TOOTH EXTRACTION      Current  Outpatient Prescriptions  Medication Sig Dispense Refill  . Aspirin-Acetaminophen-Caffeine (EXCEDRIN PO) Take by mouth as needed.    . Cholecalciferol (VITAMIN D3) 5000 units CAPS Take 1 capsule by mouth daily.    Marland Kitchen ibuprofen (ADVIL,MOTRIN) 200 MG tablet Take 200 mg by mouth as needed.     No current facility-administered medications for this visit.     Family History  Problem Relation Age of Onset  . Hypertension Mother   . Dementia Mother   . Fibroids Mother   . Heart disease Mother   . Hyperlipidemia Mother   . Stroke Mother        TIAs  . Thyroid disease Mother   . Glaucoma Father   . Diabetes Father   . Hypertension Father   . Heart disease Father   . Hyperlipidemia Father   . Heart disease Maternal Grandfather   . Heart disease Paternal Grandmother     ROS:  Pertinent items are noted in HPI.  Otherwise, a comprehensive ROS was negative.  Exam:   BP 130/76 (BP Location: Right Arm, Patient Position: Sitting, Cuff Size: Small)   Pulse 80   Resp 18   Ht 5' 3.5" (1.613 m)   Wt 171 lb 12.8 oz (77.9 kg)   LMP 06/07/2017 (Exact Date)   BMI 29.96 kg/m     General appearance: alert, cooperative and appears stated age  Head: Normocephalic, without obvious abnormality, atraumatic Neck: no adenopathy, supple, symmetrical, trachea midline and thyroid normal to inspection and palpation Lungs: clear to auscultation bilaterally Breasts: normal appearance, no masses or tenderness, No nipple retraction or dimpling, No nipple discharge or bleeding, No axillary or supraclavicular adenopathy Heart: regular rate and rhythm Abdomen:  Mass 24 week size.  Umbilical bloody drainage.  Extremities: extremities normal, atraumatic, no cyanosis or edema Skin: Skin color, texture, turgor normal. No rashes or lesions Lymph nodes: Cervical, supraclavicular, and axillary nodes normal. No abnormal inguinal nodes palpated Neurologic: Grossly normal  Pelvic: External genitalia:  no lesions               Urethra:  normal appearing urethra with no masses, tenderness or lesions              Bartholins and Skenes: normal                 Vagina: normal appearing vagina with normal color and discharge, no lesions              Cervix: no lesions              Pap taken: Yes.   Bimanual Exam:  Uterus:  24 week size and partially out of pelvis              Adnexa: no mass, fullness, tenderness              Rectal exam: Yes.  .  Confirms.              Anus:  normal sphincter tone, no lesions  Chaperone was present for exam.  Abdominal ultrasound 06/12/17:  CLINICAL DATA:  Abnormal abdominal exam with abdominal firmness, umbilical drainage  EXAM: ABDOMEN ULTRASOUND COMPLETE  COMPARISON:  None  FINDINGS: Gallbladder: Normally distended without stones or wall thickening. No pericholecystic fluid or sonographic Murphy sign.  Common bile duct: Diameter: Normal caliber 4 mm diameter  Liver: Normal appearance  IVC: Normal appearance  Pancreas: Mildly heterogeneous appearance without discrete mass  Spleen: Normal appearance, 9.3 cm length  Right Kidney: Length: 10.9 cm. Cortical thinning. Normal cortical echogenicity. No mass or hydronephrosis.  Left Kidney: Length: 11.4 cm. Minimal cortical thinning. Normal cortical echogenicity. No mass or hydronephrosis.  Abdominal aorta: Normal caliber  Other findings: No free pelvic fluid.  Incidentally noted is a large heterogeneous soft tissue echogenicity within the central abdomen which represents a markedly enlarged uterus likely containing leiomyomata, incompletely characterized by this exam.  IMPRESSION: Heterogeneous pancreatic parenchyma without discrete mass.  Abdominal firmness appears to be due to a markedly enlarged uterus extending to above the umbilicus, likely containing multiple leiomyomata.  This is incompletely characterized and assessed by upper abdominal sonography ; consider follow-up pelvic and  transvaginal sonography to characterize.   Electronically Signed   By: Lavonia Dana M.D.   On: 06/12/2017 09:26   Assessment:    Pelvic/abdominal mass.  I suspect fibroids.  Umbilical bleeding.  Rectal bleeding with menses.  Plan:  Discussion of fibroids and potential endometriosis. Needs mammogram.  Pap and HR HPV.  CT abdomen and pelvis with and without contrast.  Referral for colonoscopy after CT result is back.  I discussed anticipated hysterectomy with possible bilateral salpingo-oophorectomy.  If has bowel endometriosis, will need referral to general surgeon or care through [facility].   After visit summary provided.   ____45___ minutes face to face time of which over 50% was spent in counseling.

## 2017-06-18 NOTE — Progress Notes (Signed)
Spoke with Prentiss Bells at [facility]. Patient scheduled for CT Abdomen Pelvis with/without contrast on 06/19/17 arriving at 3:30pm for 4:30 pm appointment. [place] location. Nothing to eat after 12:30pm on 06/19/17, may drink fluids. No labs required. Patient aware of plan, verbalizes understanding and is agreeable.

## 2017-06-19 ENCOUNTER — Ambulatory Visit
Admission: RE | Admit: 2017-06-19 | Discharge: 2017-06-19 | Disposition: A | Discharge status: Home / Self Care | Payer: BC Managed Care – PPO | Source: Ambulatory Visit | Attending: Obstetrics and Gynecology | Admitting: Obstetrics and Gynecology

## 2017-06-19 ENCOUNTER — Telehealth: Payer: Self-pay | Admitting: Obstetrics and Gynecology

## 2017-06-19 DIAGNOSIS — R198 Other specified symptoms and signs involving the digestive system and abdomen: Secondary | ICD-10-CM

## 2017-06-19 DIAGNOSIS — R1900 Intra-abdominal and pelvic swelling, mass and lump, unspecified site: Secondary | ICD-10-CM

## 2017-06-19 MED ORDER — IOHEXOL 300 MG/ML  SOLN
20.0000 mL | Freq: Once | INTRAMUSCULAR | Status: AC | PRN
  Administered 2017-06-19: 20 mL via ORAL

## 2017-06-19 MED ORDER — IOPAMIDOL (ISOVUE-300) INJECTION 61%
100.0000 mL | Freq: Once | INTRAVENOUS | Status: AC | PRN
  Administered 2017-06-19: 100 mL via INTRAVENOUS

## 2017-06-19 NOTE — Telephone Encounter (Signed)
Spoke with Romie Minus at [facility] who states it is noted on the appointment for the patient's CT abdomen and pelvis that she is to have pancreatic protocol. Asking if this is needed due to reason for testing. Advised when scheduling the scheduler at [facility] informed another RN that this was standard protocol. Advised this is not needed and may be done without pancreatic protocol. Romie Minus verbalizes understanding. States that the patient will need CT abdomen and pelvis with contrast for evaluation of pelvic mass and umbilical bleeding. Romie Minus will put in new order for Dr.Silva to cosign.  Routing to provider for final review. Patient agreeable to disposition. Will close encounter.

## 2017-06-19 NOTE — Telephone Encounter (Signed)
Romie Minus at [facility] is calling to speak with a nurse about this patient's referral appointment this afternoon.

## 2017-06-22 ENCOUNTER — Telehealth: Payer: Self-pay | Admitting: Obstetrics and Gynecology

## 2017-06-22 DIAGNOSIS — K625 Hemorrhage of anus and rectum: Secondary | ICD-10-CM

## 2017-06-22 LAB — CYTOLOGY - PAP
DIAGNOSIS: NEGATIVE
HPV: NOT DETECTED

## 2017-06-22 NOTE — Telephone Encounter (Signed)
Left message to call Andersson [place] at [phone].  

## 2017-06-22 NOTE — Telephone Encounter (Signed)
Patient calling for ct scan results.

## 2017-06-22 NOTE — Telephone Encounter (Signed)
I left a message on her mobile phone that I called, and I asked for her to return the call this afternoon.   No details left.   She had her CT scan done on 06/20/17, and the results showed large uterine fibroids.   She will need to have a referral to gastroenterology due to rectal bleeding with her menses.  She may have umbilical endometriosis and could have intestinal endometriosis as well.

## 2017-06-23 NOTE — Telephone Encounter (Signed)
Spoke with patient, advised of results and recommendations as seen below per Dr. Quincy Simmonds. Patient is agreeable to referral to Dr. Collene Mares, request afternoon appointment (after 1pm).   Advised patient RN would call to schedule, will return call with appointment details. Patient states may leave detailed message on voicemail, verbalizes understanding and is agreeable.   Cc: Theresia Lo

## 2017-06-23 NOTE — Telephone Encounter (Signed)
Left message to call Faolan [place] at [phone].  

## 2017-06-23 NOTE — Telephone Encounter (Signed)
I am also routing this information to our nursing supervisor as we will ultimately be planning hysterectomy for the patient after she completes the coloscopy with Dr. Collene Mares.

## 2017-06-23 NOTE — Telephone Encounter (Signed)
Patient returning your call.

## 2017-06-24 NOTE — Telephone Encounter (Signed)
Spoke with Lattie Haw at Dr. Lorie Apley office. Patient scheduled for 06/30/17 at 1:30pm, patient request after 1pm. Earlier appointment available for 06/25/17 at 11:15am, if patient agreeable to earlier appointment can call office directly to change. Fax OV notes, referral, labs, imaging to [phone].

## 2017-06-24 NOTE — Telephone Encounter (Signed)
Left detailed message, ok per current dpr. Advised of appointment details as seen below with Dr. Collene Mares. Provided contact information and location. Advised patient to call Dr. Lorie Apley office directly if earlier date and time is an option. Advised to return call to office at [phone] if any additional questions/concerns.   Requested documents faxed to [phone].  Routing to provider for final review. Patient is agreeable to disposition. Will close encounter.

## 2017-07-15 HISTORY — PX: COLONOSCOPY WITH ESOPHAGOGASTRODUODENOSCOPY (EGD): SHX5779

## 2017-07-15 LAB — HM COLONOSCOPY

## 2017-07-16 ENCOUNTER — Telehealth: Payer: Self-pay

## 2017-07-16 ENCOUNTER — Other Ambulatory Visit: Payer: Self-pay | Admitting: Adult Health

## 2017-07-16 DIAGNOSIS — K219 Gastro-esophageal reflux disease without esophagitis: Secondary | ICD-10-CM

## 2017-07-16 MED ORDER — OMEPRAZOLE 40 MG PO CPDR: 40.0000 mg | DELAYED_RELEASE_CAPSULE | ORAL | 0 refills | Status: DC

## 2017-07-16 NOTE — Telephone Encounter (Signed)
Good Afternoon Rachel Woods,  Can you please call Rachel Woods and tell her I started her Omeprazole 40mg  each morning per Dr. Hoyt Koch recommendations.  Please ask her if she made 2 week f/u with Dr. Collene Mares (per her recommendations)?  Thanks!  Valetta Fuller

## 2017-07-17 NOTE — Telephone Encounter (Signed)
Pt states that she does have a follow up appt with Dr. Collene Mares in 2 weeks.  Dr. Collene Mares had already started her on omeprazole and told her that she can no longer take any analgesics except for Tylenol.  Pt stated that Tylenol does nothing to help with fibroid pain, headaches, pain, etc.  Pt asked what she should take when she has pain.  Advised pt that she should call Dr. Lorie Apley office for recommendations as we do not want to give her information that would be contrary to Dr. Lorie Apley advice.  Pt expressed understanding and stated that she will speak with Dr. Lorie Apley office regarding this information.  Charyl Bigger, CMA

## 2017-07-20 ENCOUNTER — Encounter: Payer: Self-pay | Admitting: Obstetrics and Gynecology

## 2017-07-21 ENCOUNTER — Telehealth: Payer: Self-pay | Admitting: Obstetrics and Gynecology

## 2017-07-21 DIAGNOSIS — R1900 Intra-abdominal and pelvic swelling, mass and lump, unspecified site: Secondary | ICD-10-CM

## 2017-07-21 NOTE — Telephone Encounter (Signed)
Please contact patient to schedule a follow up appointment with me for endometrial biopsy and discussion of test results and planning for her fibroids and heavy bleeding.  We have not yet done an endometrial biopsy to complete this portion of her evaluation.  This will need a precert.

## 2017-07-21 NOTE — Telephone Encounter (Signed)
Call to patient. Left message to call back . Per DPR, can leave message on voice mail. Left message Dr Quincy Simmonds recommends office visit for follow-up and planning for options. Appointments available as soon as tomorrow afternoon. Call back and ask for triage nurse.

## 2017-07-22 ENCOUNTER — Encounter: Payer: Self-pay | Admitting: Adult Health

## 2017-07-30 NOTE — Telephone Encounter (Signed)
Patient has been seen by Dr. Collene Mares. She is not sure when she needs to come back in for follow up with Dr. Quincy Simmonds.

## 2017-07-30 NOTE — Telephone Encounter (Signed)
Left message to call Kaitlyn at [phone]. 

## 2017-07-31 NOTE — Telephone Encounter (Signed)
Spoke with patient. Advised of message as seen below from [facility]. Patient verbalizes understanding. Appointment for EMB scheduled for 08/03/2017 at 2:30 pm with Dr.Silva. Patient is agreeable to date and time. Order already placed for precert.  Routing to provider for final review. Patient agreeable to disposition. Will close encounter.

## 2017-08-03 ENCOUNTER — Encounter: Payer: Self-pay | Admitting: Obstetrics and Gynecology

## 2017-08-03 ENCOUNTER — Other Ambulatory Visit: Payer: Self-pay | Admitting: Obstetrics and Gynecology

## 2017-08-03 ENCOUNTER — Ambulatory Visit (INDEPENDENT_AMBULATORY_CARE_PROVIDER_SITE_OTHER): Payer: BC Managed Care – PPO | Admitting: Obstetrics and Gynecology

## 2017-08-03 VITALS — BP 122/68 | Ht 63.5 in | Wt 172.8 lb

## 2017-08-03 DIAGNOSIS — N920 Excessive and frequent menstruation with regular cycle: Secondary | ICD-10-CM | POA: Diagnosis not present

## 2017-08-03 DIAGNOSIS — Z1231 Encounter for screening mammogram for malignant neoplasm of breast: Secondary | ICD-10-CM

## 2017-08-03 DIAGNOSIS — R1900 Intra-abdominal and pelvic swelling, mass and lump, unspecified site: Secondary | ICD-10-CM

## 2017-08-03 DIAGNOSIS — D219 Benign neoplasm of connective and other soft tissue, unspecified: Secondary | ICD-10-CM

## 2017-08-03 NOTE — Patient Instructions (Signed)
Leuprolide depot injection What is this medicine? LEUPROLIDE (loo PROE lide) is a man-made protein that acts like a natural hormone in the body. It decreases testosterone in men and decreases estrogen in women. In men, this medicine is used to treat advanced prostate cancer. In women, some forms of this medicine may be used to treat endometriosis, uterine fibroids, or other female hormone-related problems. This medicine may be used for other purposes; ask your health care provider or pharmacist if you have questions. COMMON BRAND NAME(S): Eligard, Lupron Depot, Lupron Depot-Ped, Viadur What should I tell my health care provider before I take this medicine? They need to know if you have any of these conditions: -diabetes -heart disease or previous heart attack -high blood pressure -high cholesterol -mental illness -osteoporosis -pain or difficulty passing urine -seizures -spinal cord metastasis -stroke -suicidal thoughts, plans, or attempt; a previous suicide attempt by you or a family member -tobacco smoker -unusual vaginal bleeding (women) -an unusual or allergic reaction to leuprolide, benzyl alcohol, other medicines, foods, dyes, or preservatives -pregnant or trying to get pregnant -breast-feeding How should I use this medicine? This medicine is for injection into a muscle or for injection under the skin. It is given by a health care professional in a hospital or clinic setting. The specific product will determine how it will be given to you. Make sure you understand which product you receive and how often you will receive it. Talk to your pediatrician regarding the use of this medicine in children. Special care may be needed. Overdosage: If you think you have taken too much of this medicine contact a poison control center or emergency room at once. NOTE: This medicine is only for you. Do not share this medicine with others. What if I miss a dose? It is important not to miss a dose.  Call your doctor or health care professional if you are unable to keep an appointment. Depot injections: Depot injections are given either once-monthly, every 12 weeks, every 16 weeks, or every 24 weeks depending on the product you are prescribed. The product you are prescribed will be based on if you are female or female, and your condition. Make sure you understand your product and dosing. What may interact with this medicine? Do not take this medicine with any of the following medications: -chasteberry This medicine may also interact with the following medications: -herbal or dietary supplements, like black cohosh or DHEA -female hormones, like estrogens or progestins and birth control pills, patches, rings, or injections -female hormones, like testosterone This list may not describe all possible interactions. Give your health care provider a list of all the medicines, herbs, non-prescription drugs, or dietary supplements you use. Also tell them if you smoke, drink alcohol, or use illegal drugs. Some items may interact with your medicine. What should I watch for while using this medicine? Visit your doctor or health care professional for regular checks on your progress. During the first weeks of treatment, your symptoms may get worse, but then will improve as you continue your treatment. You may get hot flashes, increased bone pain, increased difficulty passing urine, or an aggravation of nerve symptoms. Discuss these effects with your doctor or health care professional, some of them may improve with continued use of this medicine. Female patients may experience a menstrual cycle or spotting during the first months of therapy with this medicine. If this continues, contact your doctor or health care professional. What side effects may I notice from receiving this medicine? Side   effects that you should report to your doctor or health care professional as soon as possible: -allergic reactions like skin  rash, itching or hives, swelling of the face, lips, or tongue -breathing problems -chest pain -depression or memory disorders -pain in your legs or groin -pain at site where injected or implanted -seizures -severe headache -swelling of the feet and legs -suicidal thoughts or other mood changes -visual changes -vomiting Side effects that usually do not require medical attention (report to your doctor or health care professional if they continue or are bothersome): -breast swelling or tenderness -decrease in sex drive or performance -diarrhea -hot flashes -loss of appetite -muscle, joint, or bone pains -nausea -redness or irritation at site where injected or implanted -skin problems or acne This list may not describe all possible side effects. Call your doctor for medical advice about side effects. You may report side effects to FDA at 1-800-FDA-1088. Where should I keep my medicine? This drug is given in a hospital or clinic and will not be stored at home. NOTE: This sheet is a summary. It may not cover all possible information. If you have questions about this medicine, talk to your doctor, pharmacist, or health care provider.  2018 Elsevier/Gold Standard (2016-03-27 09:45:53) Endometrial Biopsy, Care After This sheet gives you information about how to care for yourself after your procedure. Your health care provider may also give you more specific instructions. If you have problems or questions, contact your health care provider. What can I expect after the procedure? After the procedure, it is common to have:  Mild cramping.  A small amount of vaginal bleeding for a few days. This is normal.  Follow these instructions at home:  Take over-the-counter and prescription medicines only as told by your health care provider.  Do not douche, use tampons, or have sexual intercourse until your health care provider approves.  Return to your normal activities as told by your health  care provider. Ask your health care provider what activities are safe for you.  Follow instructions from your health care provider about any activity restrictions, such as restrictions on strenuous exercise or heavy lifting. Contact a health care provider if:  You have heavy bleeding, or bleed for longer than 2 days after the procedure.  You have bad smelling discharge from your vagina.  You have a fever or chills.  You have a burning sensation when urinating or you have difficulty urinating.  You have severe pain in your lower abdomen. Get help right away if:  You have severe cramps in your stomach or back.  You pass large blood clots.  Your bleeding increases.  You become weak or light-headed, or you pass out. Summary  After the procedure, it is common to have mild cramping and a small amount of vaginal bleeding for a few days.  Do not douche, use tampons, or have sexual intercourse until your health care provider approves.  Return to your normal activities as told by your health care provider. Ask your health care provider what activities are safe for you. This information is not intended to replace advice given to you by your health care provider. Make sure you discuss any questions you have with your health care provider. Document Released: 08/03/2013 Document Revised: 10/29/2016 Document Reviewed: 10/29/2016 Elsevier Interactive Patient Education  2017 [employer].

## 2017-08-03 NOTE — Progress Notes (Signed)
GYNECOLOGY  VISIT   HPI: 51 y.o.   Divorced  Caucasian  female   G1P1 with Patient's last menstrual period was 07/27/2017 (exact date).   here for endometrial biopsy.  Has heavy menses and large fibroids.  Has umbilical drainage.   CT scan showed fibroid and no umbilical or bowel lesion.  Had upper endoscopy and colonoscopy with Dr. Collene Mares.  No colon findings of endometriosis.  Colon polyp noted.  Has esophagitis and hiatal hernia.  (Told to stop NSAIDs.)  Last menstrual cycle was not as bad.  Sometimes has pain on a scale of 8/10.   She is interested in surgery in January if possible.  States her umbilical bleeding coincides with her menses. None today.  GYNECOLOGIC HISTORY: Patient's last menstrual period was 07/27/2017 (exact date). Contraception:  none Menopausal hormone therapy:  none Last mammogram: NEVER Last pap smear: 06-18-17 Neg:Neg HR HPV        OB History    Gravida Para Term Preterm AB Living   1 1       1    SAB TAB Ectopic Multiple Live Births                     Patient Active Problem List   Diagnosis Date Noted  . Abnormal abdominal exam 06/08/2017  . Health care maintenance 03/11/2017  . Poor concentration 03/11/2017    Past Medical History:  Diagnosis Date  . ADHD   . Diverticulosis 2018  . Fibroids 2018  . Reflux esophagitis 2018    Past Surgical History:  Procedure Laterality Date  . DENTAL SURGERY    . WISDOM TOOTH EXTRACTION      Current Outpatient Prescriptions  Medication Sig Dispense Refill  . Aspirin-Acetaminophen-Caffeine (EXCEDRIN PO) Take by mouth as needed.    . Cholecalciferol (VITAMIN D3) 5000 units CAPS Take 1 capsule by mouth daily.    Marland Kitchen Dexlansoprazole (DEXILANT) 30 MG capsule Take 30 mg by mouth daily.    Marland Kitchen ibuprofen (ADVIL,MOTRIN) 200 MG tablet Take 200 mg by mouth as needed.     No current facility-administered medications for this visit.      ALLERGIES: Patient has no known allergies.  Family History   Problem Relation Age of Onset  . Hypertension Mother   . Dementia Mother   . Fibroids Mother   . Heart disease Mother   . Hyperlipidemia Mother   . Stroke Mother        TIAs  . Thyroid disease Mother   . Glaucoma Father   . Diabetes Father   . Hypertension Father   . Heart disease Father   . Hyperlipidemia Father   . Heart disease Maternal Grandfather   . Heart disease Paternal Grandmother     Social History   Social History  . Marital status: Divorced    Spouse name: N/A  . Number of children: 1  . Years of education: N/A   Occupational History  . Teacher     [employer]   Social History Main Topics  . Smoking status: Current Some Day Smoker  . Smokeless tobacco: Never Used  . Alcohol use Yes     Comment: socially  . Drug use: No  . Sexual activity: Yes    Partners: Male    Birth control/ protection: None   Other Topics Concern  . Not on file   Social History Narrative  . No narrative on file    ROS:  Pertinent items are noted in HPI.  PHYSICAL EXAMINATION:    BP 122/68 (BP Location: Right Arm, Patient Position: Sitting, Cuff Size: Normal)   Ht 5' 3.5" (1.613 m)   Wt 172 lb 12.8 oz (78.4 kg)   LMP 07/27/2017 (Exact Date)   BMI 30.13 kg/m     General appearance: alert, cooperative and appears stated age  Pelvic: External genitalia:  no lesions              Urethra:  normal appearing urethra with no masses, tenderness or lesions              Bartholins and Skenes: normal                 Vagina: normal appearing vagina with normal color and discharge, no lesions              Cervix: no lesions                Bimanual Exam:  Uterus:   23 week size from iliac crest to iliac crest.               Adnexa: no mass, fullness, tenderness           EMB Consent for procedure.  Hibiclens prep.  Pipelle to 10 cm twice.  Tissue to pathology.  Minimal EBL.  No complications.   Chaperone was present for exam.  ASSESSMENT  Fibroids.  Menorrhagia  with regular menses.  Umbilical drainage.  I suspect this is endometriosis.   PLAN  Follow up biopsy.  Instructions/precautions given.  Plan for Depo Lurpon for 3 months prior to surgery.  Discussed potential side effects.  Our goal is to do laparoscopic hysterectomy.  Schedule mammogram.    An After Visit Summary was printed and given to the patient.  ___15___ minutes face to face time of which over 50% was spent in counseling.

## 2017-08-03 NOTE — Progress Notes (Signed)
Following appointment with Dr Quincy Simmonds, screening 3D  Mammogram scheduled for 08-17-17 at 5:10 pm [facility]. Map and instructions given.   Reviewed prior authorization of Lupron and advised to expect call from speciality pharmacy with benefit information.

## 2017-08-06 ENCOUNTER — Telehealth: Payer: Self-pay | Admitting: *Deleted

## 2017-08-06 NOTE — Telephone Encounter (Signed)
Call to patient to review lab results and update on status of lupron. Voice mail has number confirmation, per DPR can leave voice mail. Left message to call back calling regarding results and medication. Ask for Gay Filler.

## 2017-08-06 NOTE — Telephone Encounter (Signed)
-----   Message from Nunzio Cobbs, MD sent at 08/05/2017 10:30 PM EDT ----- Please report benign endometrial biopsy to patient.  We are working on approval of Depo Lupron to shrink her fibroids in preparation for hysterectomy.

## 2017-08-06 NOTE — Telephone Encounter (Signed)
Return call from patient. Advised of negative endometrial biopsy result with no abnormal cells per Dr Quincy Simmonds. Advised Lupron benefits through [employer] and prior authorization has been initiated and she should hear from them with benefit information and to give authorization to ship.

## 2017-08-06 NOTE — Telephone Encounter (Signed)
See separate fax information on Lupron orders and prior auth faxed to [place].   Encounter closed.

## 2017-08-11 ENCOUNTER — Telehealth: Payer: Self-pay | Admitting: Obstetrics and Gynecology

## 2017-08-11 NOTE — Telephone Encounter (Signed)
Patient called to check on the status of scheduling her Lupron injection. It will be delivered on 08/13/17.

## 2017-08-12 NOTE — Telephone Encounter (Signed)
I have signed the physical prescription and will place it on your desk.

## 2017-08-12 NOTE — Telephone Encounter (Signed)
Spoke with Alma Friendly with [facility]. Confirmed shipment of Lupron [place] to our office for delivery on 08/13/2017.  Left message for patient to call [place] at [phone].

## 2017-08-12 NOTE — Telephone Encounter (Signed)
Patient is scheduled for injection on 08/14/2017 at 4 pm. Patient is agreeable to date and time.

## 2017-08-12 NOTE — Telephone Encounter (Signed)
Left message to call [place] at [phone].  Has patient been sexually active since her LMP? Any chance for pregnancy?

## 2017-08-12 NOTE — Telephone Encounter (Signed)
Spoke with patient. Advised Lupron Depot will be delivered to the office on 08/13/2017. Patient is ready to schedule injection. LMP was 07/27/2017. Goal is for laparoscopic hysterectomy in January.  Routing to Dr.Silva for review before closing.

## 2017-08-12 NOTE — Telephone Encounter (Signed)
We need to be sure the patient is not at risk for pregnancy in order to receive the Lupron.

## 2017-08-13 NOTE — Telephone Encounter (Signed)
Spoke with patient. Advised of message as seen below from [place]. Patient verbalizes understanding. Will call with the first day of her next menses.  Routing to provider for final review. Patient agreeable to disposition. Will close encounter.

## 2017-08-13 NOTE — Telephone Encounter (Signed)
Spoke with patient. Patient had unprotected intercourse on 08/08/2017 after LMP on 07/27/2017. Patient states "I am willing to take a pregnancy test before the injection." Advised will speak with Dr.Silva as she may need to wait to have injection with next menses. Patient verbalizes understanding.

## 2017-08-13 NOTE — Telephone Encounter (Signed)
Left message to call Georgine Wiltse at [phone]. 

## 2017-08-13 NOTE — Telephone Encounter (Signed)
Patient will need to wait until menses to have the Depo Lupron.  She is at potential risk for pregnancy at this time.

## 2017-08-14 ENCOUNTER — Ambulatory Visit: Payer: BC Managed Care – PPO

## 2017-08-17 ENCOUNTER — Ambulatory Visit
Admission: RE | Admit: 2017-08-17 | Discharge: 2017-08-17 | Disposition: A | Discharge status: Home / Self Care | Payer: BC Managed Care – PPO | Source: Ambulatory Visit | Attending: Obstetrics and Gynecology | Admitting: Obstetrics and Gynecology

## 2017-08-17 DIAGNOSIS — Z1231 Encounter for screening mammogram for malignant neoplasm of breast: Secondary | ICD-10-CM

## 2017-08-19 ENCOUNTER — Other Ambulatory Visit: Payer: Self-pay | Admitting: Obstetrics and Gynecology

## 2017-08-19 DIAGNOSIS — R928 Other abnormal and inconclusive findings on diagnostic imaging of breast: Secondary | ICD-10-CM

## 2017-08-20 ENCOUNTER — Ambulatory Visit (INDEPENDENT_AMBULATORY_CARE_PROVIDER_SITE_OTHER): Payer: BC Managed Care – PPO

## 2017-08-20 VITALS — BP 110/60 | HR 72 | Resp 16 | Wt 172.0 lb

## 2017-08-20 DIAGNOSIS — N926 Irregular menstruation, unspecified: Secondary | ICD-10-CM | POA: Diagnosis not present

## 2017-08-20 LAB — POCT URINE PREGNANCY: Preg Test, Ur: NEGATIVE

## 2017-08-20 MED ORDER — LEUPROLIDE ACETATE 3.75 MG IM KIT
3.7500 mg | PACK | Freq: Once | INTRAMUSCULAR | Status: AC
  Administered 2017-08-20: 3.75 mg via INTRAMUSCULAR

## 2017-08-20 NOTE — Progress Notes (Signed)
51 y.o. Divorced Caucasian female here for her first Depo Lupron injection.  Indication for injection:  Fibroids  LMP:  08/19/17  Contraception:  None  UPT: Negative  Lupron order:  3.75mg  IM x 1dosages.    Order to administer given by Dr. Margy Clarks 08/03/17.  Injection administered in RUOQ and tolerated well.

## 2017-08-24 ENCOUNTER — Ambulatory Visit
Admission: RE | Admit: 2017-08-24 | Discharge: 2017-08-24 | Disposition: A | Discharge status: Home / Self Care | Payer: BC Managed Care – PPO | Source: Ambulatory Visit | Attending: Obstetrics and Gynecology | Admitting: Obstetrics and Gynecology

## 2017-08-24 ENCOUNTER — Other Ambulatory Visit: Payer: Self-pay | Admitting: Obstetrics and Gynecology

## 2017-08-24 DIAGNOSIS — N6310 Unspecified lump in the right breast, unspecified quadrant: Secondary | ICD-10-CM

## 2017-08-24 DIAGNOSIS — R928 Other abnormal and inconclusive findings on diagnostic imaging of breast: Secondary | ICD-10-CM

## 2017-08-28 ENCOUNTER — Ambulatory Visit
Admission: RE | Admit: 2017-08-28 | Discharge: 2017-08-28 | Disposition: A | Discharge status: Home / Self Care | Payer: BC Managed Care – PPO | Source: Ambulatory Visit | Attending: Obstetrics and Gynecology | Admitting: Obstetrics and Gynecology

## 2017-08-28 ENCOUNTER — Other Ambulatory Visit: Payer: Self-pay | Admitting: Obstetrics and Gynecology

## 2017-08-28 DIAGNOSIS — N6310 Unspecified lump in the right breast, unspecified quadrant: Secondary | ICD-10-CM

## 2017-09-10 ENCOUNTER — Telehealth: Payer: Self-pay | Admitting: Obstetrics and Gynecology

## 2017-09-10 NOTE — Telephone Encounter (Signed)
Patient called and said her depo lupron medication will be delivered to our office on 09/14/17, per a telephone call from her insurance company. She scheduled an appointment with the nurse on 09/15/17 at 2:00 PM. Routing to triage to confirm details. Patient is aware she may receive a call back only if needed.

## 2017-09-10 NOTE — Telephone Encounter (Signed)
Left message to call Sharee Pimple at [phone].  Reschedule nurse visit. Last Lupron 08/20/17. Schedule 30 days after last injection (after 09/18/17) with menses.

## 2017-09-11 NOTE — Telephone Encounter (Signed)
Kaitlyn calling from [place] to confirm delivery address and date for Lupron. Will be delivered to [facility] on 09/15/17.

## 2017-09-11 NOTE — Telephone Encounter (Signed)
Thank you for the update!

## 2017-09-11 NOTE — Telephone Encounter (Signed)
Spoke with patient. Advised last Lupron injection 08/20/17, next injection to be scheduled 30 days after (after 11/23) and with menses.   Patient states she was advised that she will be in menopause after first injection and would not need to schedule with menses after first injection. Agreeable to changing injection date, request late appointment.   Patient also asking to clarify plan for possible surgery in January. 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   Advised patient will review with nursing supervisor and return call with recommendations. Patient is agreeable and request detailed message be left on cell phone.

## 2017-09-11 NOTE — Telephone Encounter (Signed)
Spoke with patient, confirmed next Lupron injection scheduled for 09/21/17 at 4pm.   Patient request afternoon PUS, 1/3 is first day back to school.  PUS scheduled for 10/29/17 at 2:30pm with consult to follow at 3pm.   Patient verbalizes understanding and is agreeable.    Returned call to patient to confirm patient is using contraceptive. Left message to call Sharee Pimple at [phone].

## 2017-09-11 NOTE — Telephone Encounter (Signed)
Reviewed with nursing supervisor, Kandace Blitz RN.  1. Confirmed second Lupron injection not cycle dependent.  2. Recommended re-scheduling Lupron to 11/26.   3. Confirm patient is using contraceptive.   4. PUS recommended 10 weeks from start of Lupron. 10/29/17  5. Surgery scheduling pending PUS results. Tentatively 11/10/17 or 11/17/17.   Nurse visit reschedule to 11/26 at 4pm,   Call returned to patient, advised as seen above. Please return call to office at [phone] to confirm message received and to schedule PUS.   Routing to Dr. Antony Blackbird.   Cc: Lamont Snowball, RN

## 2017-09-11 NOTE — Telephone Encounter (Signed)
Routing to Dr. Silva FYI.  

## 2017-09-15 ENCOUNTER — Ambulatory Visit: Payer: BC Managed Care – PPO

## 2017-09-21 ENCOUNTER — Ambulatory Visit (INDEPENDENT_AMBULATORY_CARE_PROVIDER_SITE_OTHER): Payer: BC Managed Care – PPO | Admitting: *Deleted

## 2017-09-21 ENCOUNTER — Other Ambulatory Visit: Payer: Self-pay

## 2017-09-21 VITALS — BP 136/80 | HR 82 | Resp 16 | Wt 170.5 lb

## 2017-09-21 DIAGNOSIS — N926 Irregular menstruation, unspecified: Secondary | ICD-10-CM

## 2017-09-21 DIAGNOSIS — Z01812 Encounter for preprocedural laboratory examination: Secondary | ICD-10-CM | POA: Diagnosis not present

## 2017-09-21 LAB — POCT URINE PREGNANCY: Preg Test, Ur: NEGATIVE

## 2017-09-21 MED ORDER — LEUPROLIDE ACETATE 3.75 MG IM KIT
3.7500 mg | PACK | Freq: Once | INTRAMUSCULAR | Status: AC
  Administered 2017-09-21: 3.75 mg via INTRAMUSCULAR

## 2017-09-21 NOTE — Progress Notes (Signed)
51 y.o. Divorced Caucasian female here for her second Depo Lupron injection.  Indication for injection:  Fibroids  LMP:  08/19/17  Contraception:  None. UPT= negative.  Lupron order:  3.75 mg IM x 1 dosage.    Order to administer given by Dr. Quincy Simmonds on August 03, 2017.  Injection administered in LUOQ and tolerated well.

## 2017-10-21 ENCOUNTER — Ambulatory Visit (INDEPENDENT_AMBULATORY_CARE_PROVIDER_SITE_OTHER): Payer: BC Managed Care – PPO

## 2017-10-21 ENCOUNTER — Other Ambulatory Visit: Payer: Self-pay

## 2017-10-21 VITALS — BP 122/80 | HR 84 | Resp 16 | Wt 172.0 lb

## 2017-10-21 DIAGNOSIS — D219 Benign neoplasm of connective and other soft tissue, unspecified: Secondary | ICD-10-CM

## 2017-10-21 LAB — POCT URINE PREGNANCY: PREG TEST UR: NEGATIVE

## 2017-10-21 MED ORDER — LEUPROLIDE ACETATE 3.75 MG IM KIT
3.7500 mg | PACK | Freq: Once | INTRAMUSCULAR | Status: AC
  Administered 2017-10-21: 3.75 mg via INTRAMUSCULAR

## 2017-10-21 NOTE — Progress Notes (Signed)
51 y.o. Divorced Caucasian female here for her third Depo Lupron injection.  Indication for injection:  Fibroids  LMP:  08/19/17  Contraception:  None. UPT = Negative  Lupron order:  3.75mg  IM x 1dosages.    Order to administer given by Dr. Quincy Simmonds on August 03, 2017.  Injection administered in RUOQ and tolerated well.

## 2017-10-23 ENCOUNTER — Other Ambulatory Visit: Payer: Self-pay | Admitting: *Deleted

## 2017-10-23 DIAGNOSIS — D219 Benign neoplasm of connective and other soft tissue, unspecified: Secondary | ICD-10-CM

## 2017-10-23 DIAGNOSIS — N920 Excessive and frequent menstruation with regular cycle: Secondary | ICD-10-CM

## 2017-10-23 DIAGNOSIS — R1900 Intra-abdominal and pelvic swelling, mass and lump, unspecified site: Secondary | ICD-10-CM

## 2017-10-23 NOTE — Progress Notes (Signed)
.  tran

## 2017-10-29 ENCOUNTER — Other Ambulatory Visit: Payer: Self-pay

## 2017-10-29 ENCOUNTER — Other Ambulatory Visit: Payer: BC Managed Care – PPO

## 2017-10-29 ENCOUNTER — Ambulatory Visit (INDEPENDENT_AMBULATORY_CARE_PROVIDER_SITE_OTHER): Payer: BC Managed Care – PPO

## 2017-10-29 ENCOUNTER — Ambulatory Visit: Payer: BC Managed Care – PPO | Admitting: Obstetrics & Gynecology

## 2017-10-29 ENCOUNTER — Other Ambulatory Visit: Payer: Self-pay | Admitting: Obstetrics and Gynecology

## 2017-10-29 VITALS — BP 146/80 | HR 80 | Resp 16 | Ht 63.5 in | Wt 172.0 lb

## 2017-10-29 DIAGNOSIS — N920 Excessive and frequent menstruation with regular cycle: Secondary | ICD-10-CM | POA: Diagnosis not present

## 2017-10-29 DIAGNOSIS — D219 Benign neoplasm of connective and other soft tissue, unspecified: Secondary | ICD-10-CM | POA: Diagnosis not present

## 2017-10-29 DIAGNOSIS — R1900 Intra-abdominal and pelvic swelling, mass and lump, unspecified site: Secondary | ICD-10-CM | POA: Diagnosis not present

## 2017-10-29 DIAGNOSIS — R198 Other specified symptoms and signs involving the digestive system and abdomen: Secondary | ICD-10-CM

## 2017-10-29 NOTE — Progress Notes (Unsigned)
GYNECOLOGY  VISIT  CC:   Repeat ultrasound and discuss plans for care  HPI: 52 y.o. G1P1 Divorced Caucasian female here for repeat ultrasound after being on Depo Provera for the past 3 months.  Pt is not having any bleeding.  She has been seeing Dr. Quincy Simmonds and want sot make plans for surgery.  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  Feels that her uterus has gotten smaller and is having less pressure symptoms.  Energy is good.  Endometrial biopsy 08/03/17 was negative.    Has hx of umbilical drainage with cycle but has not had any since being on Depo Provera.  GYNECOLOGIC HISTORY: Patient's last menstrual period was 08/19/2017. Contraception: none Menopausal hormone therapy: n/a  Patient Active Problem List   Diagnosis Date Noted  . Abnormal abdominal exam 06/08/2017  . Health care maintenance 03/11/2017  . Poor concentration 03/11/2017    Past Medical History:  Diagnosis Date  . ADHD   . Diverticulosis 2018  . Fibroids 2018  . Reflux esophagitis 2018    Past Surgical History:  Procedure Laterality Date  . DENTAL SURGERY    . WISDOM TOOTH EXTRACTION      MEDS:   Current Outpatient Medications on File Prior to Visit  Medication Sig Dispense Refill  . Aspirin-Acetaminophen-Caffeine (EXCEDRIN PO) Take by mouth as needed.    . Cholecalciferol (VITAMIN D3) 5000 units CAPS Take 1 capsule by mouth daily.    Marland Kitchen ibuprofen (ADVIL,MOTRIN) 200 MG tablet Take 200 mg by mouth as needed.    Marland Kitchen OMEPRAZOLE PO Take 20 mg by mouth daily.     No current facility-administered medications on file prior to visit.     ALLERGIES: Patient has no known allergies.  Family History  Problem Relation Age of Onset  . Hypertension Mother   . Dementia Mother   . Fibroids Mother   . Heart disease Mother   . Hyperlipidemia Mother   . Stroke Mother        TIAs  . Thyroid disease Mother   . Glaucoma Father   . Diabetes Father   . Hypertension  Father   . Heart disease Father   . Hyperlipidemia Father   . Heart disease Maternal Grandfather   . Heart disease Paternal Grandmother     SH:  Divorced, non smoker  Review of Systems  Gastrointestinal:       Abdominal mass  All other systems reviewed and are negative.   PHYSICAL EXAMINATION:    BP (!) 146/80 (BP Location: Right Arm, Patient Position: Sitting, Cuff Size: Normal)   Pulse 80   Resp 16   Ht 5' 3.5" (1.613 m)   Wt 172 lb (78 kg)   LMP 08/19/2017   BMI 29.99 kg/m     General appearance: alert, cooperative and appears stated age CV:  Regular rate and rhythm Lungs:  clear to auscultation, no wheezes, rales or rhonchi, symmetric air entry Abdomen: soft, non-tender; bowel sounds normal; abdominal mass up to umbilicus and fills pelvis, mobile, no organomegaly  Pelvic: External genitalia:  no lesions              Urethra:  normal appearing urethra with no masses, tenderness or lesions              Bartholins and Skenes: normal                 Vagina: normal appearing vagina with normal color  and discharge, no lesions              Cervix: no lesions              Bimanual Exam:  Uterus:  enlarged, 20 weeks size              Adnexa: no mass, fullness, tenderness, difficult to palpate.              Anus:  no lesions  Ultrasound: Uterus:  17.4 x 13.1 x 10.5cm with multiple fibroids measuring 7cm, 7cm, 7cm, and 5.5cm Endometrium:  4.20mm Left ovary:  4 x 1.6 x 5.8NI with 7.7OE follicle noted Right ovary:  Not seen Cul de sac:  No free fluid  Chaperone was present for exam.  Findings reviewed.  Pt desires to proceed with hysterectomy at this time.  D/w pt laparoscopic versus open procedure.  She does not care which procedure is done as long as the hysterectomy is now scheduled.  She is aware that Dr. Quincy Simmonds may not be available for this procedure and she is ok with this.    Procedure discussed with patient.  Hospital stay, recovery and pain management all discussed.   Risks discussed including but not limited to bleeding, 1% risk of receiving a  transfusion, infection, 3-4% risk of bowel/bladder/ureteral/vascular injury discussed as well as possible need for additional surgery if injury does occur discussed.  DVT/PE and rare risk of death discussed.  My actual complications with prior surgeries discussed.  Vaginal cuff dehiscence discussed.  Hernia formation discussed.  Positioning and incision locations discussed.  Patient aware if pathology abnormal she may need additional treatment.  All questions answered.    Assessment: Enlarged fibroid uterus, s/p 3 months of Depo Lupon with decrease in size of uterus  Plan: Will proceed with scheduling surgery at this time.  Pre-op prep discussed.   ~30 minutes spent with patient >50% of time was in face to face discussion of above.

## 2017-10-30 ENCOUNTER — Telehealth: Payer: Self-pay | Admitting: Obstetrics & Gynecology

## 2017-10-30 NOTE — Telephone Encounter (Signed)
Patient returned call. Reviewed benefits for surgery scheduled on 11/17/17. Patient understood information presented. Patient will call back by 11/06/17 to satisfy pre-surgery requirements

## 2017-10-30 NOTE — Telephone Encounter (Signed)
Call placed to patient to review benefits for surgical procedure. Left voicemail requesting a return call.

## 2017-11-01 DIAGNOSIS — D219 Benign neoplasm of connective and other soft tissue, unspecified: Secondary | ICD-10-CM | POA: Insufficient documentation

## 2017-11-01 DIAGNOSIS — N920 Excessive and frequent menstruation with regular cycle: Secondary | ICD-10-CM | POA: Insufficient documentation

## 2017-11-01 DIAGNOSIS — R198 Other specified symptoms and signs involving the digestive system and abdomen: Secondary | ICD-10-CM | POA: Insufficient documentation

## 2017-11-05 ENCOUNTER — Other Ambulatory Visit: Payer: Self-pay | Admitting: Obstetrics & Gynecology

## 2017-11-06 NOTE — Patient Instructions (Addendum)
Your procedure is scheduled on:  Tuesday, Jan 22  Enter through the Main Entrance of [facility] at: 6 am  Pick up the phone at the desk and dial [phone].  Call this number if you have problems the morning of surgery: [phone].  Remember: Do NOT eat or Do NOT drink clear liquids (including water) after midnight Monday  Take these medicines the morning of surgery with a SIP OF WATER:  Prilosec  Do Not smoke on the day of surgery.  Stop herbal medications and supplements at this time.  Do NOT wear jewelry (body piercing), metal hair clips/bobby pins, make-up, or nail polish. Do NOT wear lotions, powders, or perfumes.  You may wear deoderant. Do NOT shave for 48 hours prior to surgery. Do NOT bring valuables to the hospital.  Leave suitcase in car.  After surgery it may be brought to your room.  For patients admitted to the hospital, checkout time is 11:00 AM the day of discharge. Have a responsible adult drive you home and stay with you for 24 hours after your procedure.  Home with partner Legrand Como cell [phone]

## 2017-11-09 ENCOUNTER — Telehealth: Payer: Self-pay | Admitting: Obstetrics & Gynecology

## 2017-11-09 NOTE — Telephone Encounter (Signed)
Call placed to patient to advise FMLA forms have been completed and are ready for pick up. Will also need to collect administration fee. Left voicemail message requesting a return call.

## 2017-11-09 NOTE — Telephone Encounter (Signed)
Patient has confirmed pre-surgery requirements. Patient is scheduled for procedure on 11/17/17

## 2017-11-09 NOTE — Telephone Encounter (Signed)
Patient returned call. Patient has provided the administration fee for completion of FMLA forms. Patient will stop office on 11/11/17 to pick up FMLA forms.

## 2017-11-11 ENCOUNTER — Telehealth: Payer: Self-pay

## 2017-11-11 ENCOUNTER — Encounter (HOSPITAL_COMMUNITY): Payer: Self-pay

## 2017-11-11 ENCOUNTER — Encounter (HOSPITAL_COMMUNITY)
Admission: RE | Admit: 2017-11-11 | Discharge: 2017-11-11 | Disposition: A | Discharge status: Home / Self Care | Payer: BC Managed Care – PPO | Source: Ambulatory Visit | Attending: Obstetrics & Gynecology | Admitting: Obstetrics & Gynecology

## 2017-11-11 ENCOUNTER — Other Ambulatory Visit: Payer: Self-pay

## 2017-11-11 DIAGNOSIS — Z01818 Encounter for other preprocedural examination: Secondary | ICD-10-CM | POA: Diagnosis present

## 2017-11-11 DIAGNOSIS — D259 Leiomyoma of uterus, unspecified: Secondary | ICD-10-CM | POA: Diagnosis not present

## 2017-11-11 HISTORY — DX: Hyperlipidemia, unspecified: E78.5

## 2017-11-11 HISTORY — DX: Headache: R51

## 2017-11-11 HISTORY — DX: Headache, unspecified: R51.9

## 2017-11-11 HISTORY — DX: Gastro-esophageal reflux disease without esophagitis: K21.9

## 2017-11-11 LAB — ABO/RH: ABO/RH(D): A POS

## 2017-11-11 LAB — TYPE AND SCREEN
ABO/RH(D): A POS
ANTIBODY SCREEN: NEGATIVE

## 2017-11-11 LAB — CBC
HEMATOCRIT: 42.7 % (ref 36.0–46.0)
HEMOGLOBIN: 14.3 g/dL (ref 12.0–15.0)
MCH: 27.4 pg (ref 26.0–34.0)
MCHC: 33.5 g/dL (ref 30.0–36.0)
MCV: 81.8 fL (ref 78.0–100.0)
Platelets: 336 10*3/uL (ref 150–400)
RBC: 5.22 MIL/uL — ABNORMAL HIGH (ref 3.87–5.11)
RDW: 14 % (ref 11.5–15.5)
WBC: 7.1 10*3/uL (ref 4.0–10.5)

## 2017-11-11 NOTE — Telephone Encounter (Signed)
Left message to call [place] at [phone].  Need to give patient bowel prep instructions for surgery. May eat breakfast day before surgery. Needs to be on clear liquids after breakfast day before surgery. Needs to drink 1 bottle of magnesium citrate between 10 am and 12 pm day before surgery.

## 2017-11-16 NOTE — Anesthesia Preprocedure Evaluation (Addendum)
Anesthesia Evaluation  Patient identified by MRN, date of birth, ID band Patient awake    Reviewed: Allergy & Precautions, H&P , Patient's Chart, lab work & pertinent test results, reviewed documented beta blocker date and time   Airway Mallampati: II  TM Distance: >3 FB Neck ROM: full    Dental no notable dental hx.    Pulmonary Current Smoker,    Pulmonary exam normal breath sounds clear to auscultation       Cardiovascular  Rhythm:regular Rate:Normal     Neuro/Psych    GI/Hepatic   Endo/Other    Renal/GU      Musculoskeletal   Abdominal   Peds  Hematology   Anesthesia Other Findings   Reproductive/Obstetrics                             Anesthesia Physical Anesthesia Plan  ASA: II  Anesthesia Plan: General   Post-op Pain Management:    Induction: Intravenous  PONV Risk Score and Plan:   Airway Management Planned: Oral ETT  Additional Equipment:   Intra-op Plan:   Post-operative Plan: Extubation in OR  Informed Consent: I have reviewed the patients History and Physical, chart, labs and discussed the procedure including the risks, benefits and alternatives for the proposed anesthesia with the patient or authorized representative who has indicated his/her understanding and acceptance.   Dental Advisory Given  Plan Discussed with: CRNA and Surgeon  Anesthesia Plan Comments: (  )        Anesthesia Quick Evaluation

## 2017-11-17 ENCOUNTER — Encounter (HOSPITAL_COMMUNITY)
Admission: RE | Disposition: A | Discharge status: Home / Self Care | Payer: Self-pay | Source: Ambulatory Visit | Attending: Obstetrics & Gynecology

## 2017-11-17 ENCOUNTER — Ambulatory Visit (HOSPITAL_COMMUNITY): Payer: BC Managed Care – PPO | Admitting: Anesthesiology

## 2017-11-17 ENCOUNTER — Other Ambulatory Visit: Payer: Self-pay

## 2017-11-17 ENCOUNTER — Ambulatory Visit (HOSPITAL_COMMUNITY)
Admission: RE | Admit: 2017-11-17 | Discharge: 2017-11-18 | Disposition: A | Discharge status: Home / Self Care | Payer: BC Managed Care – PPO | Source: Ambulatory Visit | Attending: Obstetrics & Gynecology | Admitting: Obstetrics & Gynecology

## 2017-11-17 ENCOUNTER — Encounter (HOSPITAL_COMMUNITY): Payer: Self-pay

## 2017-11-17 DIAGNOSIS — Z833 Family history of diabetes mellitus: Secondary | ICD-10-CM | POA: Diagnosis not present

## 2017-11-17 DIAGNOSIS — F909 Attention-deficit hyperactivity disorder, unspecified type: Secondary | ICD-10-CM | POA: Diagnosis not present

## 2017-11-17 DIAGNOSIS — Z8249 Family history of ischemic heart disease and other diseases of the circulatory system: Secondary | ICD-10-CM | POA: Diagnosis not present

## 2017-11-17 DIAGNOSIS — Z79899 Other long term (current) drug therapy: Secondary | ICD-10-CM | POA: Insufficient documentation

## 2017-11-17 DIAGNOSIS — N888 Other specified noninflammatory disorders of cervix uteri: Secondary | ICD-10-CM | POA: Insufficient documentation

## 2017-11-17 DIAGNOSIS — Z823 Family history of stroke: Secondary | ICD-10-CM | POA: Insufficient documentation

## 2017-11-17 DIAGNOSIS — E785 Hyperlipidemia, unspecified: Secondary | ICD-10-CM | POA: Diagnosis not present

## 2017-11-17 DIAGNOSIS — N803 Endometriosis of pelvic peritoneum: Secondary | ICD-10-CM | POA: Insufficient documentation

## 2017-11-17 DIAGNOSIS — D259 Leiomyoma of uterus, unspecified: Secondary | ICD-10-CM | POA: Diagnosis not present

## 2017-11-17 DIAGNOSIS — Z7982 Long term (current) use of aspirin: Secondary | ICD-10-CM | POA: Insufficient documentation

## 2017-11-17 DIAGNOSIS — F17210 Nicotine dependence, cigarettes, uncomplicated: Secondary | ICD-10-CM | POA: Insufficient documentation

## 2017-11-17 DIAGNOSIS — K210 Gastro-esophageal reflux disease with esophagitis: Secondary | ICD-10-CM | POA: Diagnosis not present

## 2017-11-17 DIAGNOSIS — N852 Hypertrophy of uterus: Secondary | ICD-10-CM | POA: Diagnosis not present

## 2017-11-17 DIAGNOSIS — N809 Endometriosis, unspecified: Secondary | ICD-10-CM

## 2017-11-17 DIAGNOSIS — D219 Benign neoplasm of connective and other soft tissue, unspecified: Secondary | ICD-10-CM | POA: Diagnosis not present

## 2017-11-17 HISTORY — PX: SALPINGOOPHORECTOMY: SHX82

## 2017-11-17 HISTORY — PX: TOTAL LAPAROSCOPIC HYSTERECTOMY WITH SALPINGECTOMY: SHX6742

## 2017-11-17 HISTORY — PX: CYSTOSCOPY: SHX5120

## 2017-11-17 LAB — HCG, SERUM, QUALITATIVE: PREG SERUM: NEGATIVE

## 2017-11-17 LAB — HEMOGLOBIN: HEMOGLOBIN: 11.3 g/dL — AB (ref 12.0–15.0)

## 2017-11-17 SURGERY — HYSTERECTOMY, TOTAL, LAPAROSCOPIC, WITH SALPINGECTOMY
Anesthesia: General | Laterality: Right

## 2017-11-17 MED ORDER — SCOPOLAMINE 1 MG/3DAYS TD PT72
1.0000 | MEDICATED_PATCH | Freq: Once | TRANSDERMAL | Status: DC
  Administered 2017-11-17: 1.5 mg via TRANSDERMAL

## 2017-11-17 MED ORDER — LIDOCAINE HCL (CARDIAC) 20 MG/ML IV SOLN
INTRAVENOUS | Status: AC
  Filled 2017-11-17: qty 5

## 2017-11-17 MED ORDER — SODIUM CHLORIDE 0.9 % IJ SOLN
INTRAMUSCULAR | Status: AC
  Filled 2017-11-17: qty 50

## 2017-11-17 MED ORDER — KETOROLAC TROMETHAMINE 30 MG/ML IJ SOLN
30.0000 mg | Freq: Four times a day (QID) | INTRAMUSCULAR | Status: DC
  Administered 2017-11-17 – 2017-11-18 (×3): 30 mg via INTRAVENOUS
  Filled 2017-11-17 (×3): qty 1

## 2017-11-17 MED ORDER — ROPIVACAINE HCL 5 MG/ML IJ SOLN
INTRAMUSCULAR | Status: AC
  Filled 2017-11-17: qty 30

## 2017-11-17 MED ORDER — DEXAMETHASONE SODIUM PHOSPHATE 4 MG/ML IJ SOLN
INTRAMUSCULAR | Status: AC
  Filled 2017-11-17: qty 1

## 2017-11-17 MED ORDER — ONDANSETRON HCL 4 MG/2ML IJ SOLN
INTRAMUSCULAR | Status: DC | PRN
  Administered 2017-11-17 (×2): 4 mg via INTRAVENOUS

## 2017-11-17 MED ORDER — GLYCOPYRROLATE 0.2 MG/ML IJ SOLN
INTRAMUSCULAR | Status: DC | PRN
  Administered 2017-11-17: 0.2 mg via INTRAVENOUS

## 2017-11-17 MED ORDER — ONDANSETRON HCL 4 MG/2ML IJ SOLN
INTRAMUSCULAR | Status: AC
  Filled 2017-11-17: qty 2

## 2017-11-17 MED ORDER — DEXAMETHASONE SODIUM PHOSPHATE 4 MG/ML IJ SOLN
INTRAMUSCULAR | Status: DC | PRN
  Administered 2017-11-17 (×2): 4 mg via INTRAVENOUS

## 2017-11-17 MED ORDER — HYDROMORPHONE HCL 1 MG/ML IJ SOLN: 0.2500 mg | INTRAMUSCULAR | Status: DC | PRN

## 2017-11-17 MED ORDER — ROCURONIUM BROMIDE 100 MG/10ML IV SOLN
INTRAVENOUS | Status: AC
  Filled 2017-11-17: qty 1

## 2017-11-17 MED ORDER — PROPOFOL 10 MG/ML IV BOLUS
INTRAVENOUS | Status: DC | PRN
  Administered 2017-11-17: 200 mg via INTRAVENOUS

## 2017-11-17 MED ORDER — FENTANYL CITRATE (PF) 250 MCG/5ML IJ SOLN
INTRAMUSCULAR | Status: AC
  Filled 2017-11-17: qty 5

## 2017-11-17 MED ORDER — BUPIVACAINE HCL (PF) 0.25 % IJ SOLN
INTRAMUSCULAR | Status: DC | PRN
  Administered 2017-11-17: 10 mL

## 2017-11-17 MED ORDER — MENTHOL 3 MG MT LOZG: 1.0000 | LOZENGE | OROMUCOSAL | Status: DC | PRN

## 2017-11-17 MED ORDER — STERILE WATER FOR IRRIGATION IR SOLN
Status: DC | PRN
  Administered 2017-11-17: 1000 mL via INTRAVESICAL

## 2017-11-17 MED ORDER — LIDOCAINE HCL (CARDIAC) 20 MG/ML IV SOLN
INTRAVENOUS | Status: DC | PRN
  Administered 2017-11-17: 80 mg via INTRAVENOUS

## 2017-11-17 MED ORDER — MIDAZOLAM HCL 2 MG/2ML IJ SOLN
INTRAMUSCULAR | Status: DC | PRN
  Administered 2017-11-17: 2 mg via INTRAVENOUS

## 2017-11-17 MED ORDER — LACTATED RINGERS IV SOLN
INTRAVENOUS | Status: DC
  Administered 2017-11-17: 125 mL/h via INTRAVENOUS
  Administered 2017-11-17 (×2): via INTRAVENOUS

## 2017-11-17 MED ORDER — PROPOFOL 10 MG/ML IV BOLUS
INTRAVENOUS | Status: AC
  Filled 2017-11-17: qty 20

## 2017-11-17 MED ORDER — DEXAMETHASONE SODIUM PHOSPHATE 10 MG/ML IJ SOLN
INTRAMUSCULAR | Status: AC
  Filled 2017-11-17: qty 1

## 2017-11-17 MED ORDER — LACTATED RINGERS IV BOLUS (SEPSIS)
500.0000 mL | Freq: Once | INTRAVENOUS | Status: AC
  Administered 2017-11-17: 500 mL via INTRAVENOUS

## 2017-11-17 MED ORDER — MORPHINE SULFATE (PF) 4 MG/ML IV SOLN
1.0000 mg | INTRAVENOUS | Status: DC | PRN
  Administered 2017-11-17: 2 mg via INTRAVENOUS
  Filled 2017-11-17: qty 1

## 2017-11-17 MED ORDER — ONDANSETRON HCL 4 MG/2ML IJ SOLN
4.0000 mg | Freq: Four times a day (QID) | INTRAMUSCULAR | Status: DC | PRN
  Administered 2017-11-17: 4 mg via INTRAVENOUS
  Filled 2017-11-17: qty 2

## 2017-11-17 MED ORDER — KETOROLAC TROMETHAMINE 30 MG/ML IJ SOLN: 30.0000 mg | Freq: Four times a day (QID) | INTRAMUSCULAR | Status: DC

## 2017-11-17 MED ORDER — ALUM & MAG HYDROXIDE-SIMETH 200-200-20 MG/5ML PO SUSP: 30.0000 mL | ORAL | Status: DC | PRN

## 2017-11-17 MED ORDER — DEXTROSE-NACL 5-0.45 % IV SOLN
INTRAVENOUS | Status: DC
  Administered 2017-11-17: 17:00:00 via INTRAVENOUS

## 2017-11-17 MED ORDER — ENOXAPARIN SODIUM 40 MG/0.4ML ~~LOC~~ SOLN
40.0000 mg | SUBCUTANEOUS | Status: DC
  Filled 2017-11-17: qty 0.4

## 2017-11-17 MED ORDER — CEFOTETAN DISODIUM-DEXTROSE 2-2.08 GM-%(50ML) IV SOLR
2.0000 g | Freq: Once | INTRAVENOUS | Status: AC
  Administered 2017-11-17: 2 g via INTRAVENOUS
  Filled 2017-11-17: qty 50

## 2017-11-17 MED ORDER — MIDAZOLAM HCL 2 MG/2ML IJ SOLN
INTRAMUSCULAR | Status: AC
  Filled 2017-11-17: qty 2

## 2017-11-17 MED ORDER — SODIUM CHLORIDE 0.9 % IV SOLN
INTRAVENOUS | Status: DC | PRN
  Administered 2017-11-17: 40 mL

## 2017-11-17 MED ORDER — HYDROMORPHONE HCL 1 MG/ML IJ SOLN
INTRAMUSCULAR | Status: AC
  Filled 2017-11-17: qty 1

## 2017-11-17 MED ORDER — SIMETHICONE 80 MG PO CHEW: 80.0000 mg | CHEWABLE_TABLET | Freq: Four times a day (QID) | ORAL | Status: DC | PRN

## 2017-11-17 MED ORDER — SODIUM CHLORIDE 0.9 % IR SOLN
Status: DC | PRN
  Administered 2017-11-17: 3000 mL

## 2017-11-17 MED ORDER — ENOXAPARIN SODIUM 40 MG/0.4ML ~~LOC~~ SOLN
40.0000 mg | SUBCUTANEOUS | Status: AC
  Administered 2017-11-17: 40 mg via SUBCUTANEOUS

## 2017-11-17 MED ORDER — BUPIVACAINE HCL (PF) 0.25 % IJ SOLN
INTRAMUSCULAR | Status: AC
  Filled 2017-11-17: qty 30

## 2017-11-17 MED ORDER — SCOPOLAMINE 1 MG/3DAYS TD PT72
MEDICATED_PATCH | TRANSDERMAL | Status: AC
  Administered 2017-11-17: 1.5 mg via TRANSDERMAL
  Filled 2017-11-17: qty 1

## 2017-11-17 MED ORDER — ACETAMINOPHEN 325 MG PO TABS: 650.0000 mg | ORAL_TABLET | ORAL | Status: DC | PRN

## 2017-11-17 MED ORDER — LACTATED RINGERS IV SOLN
Freq: Once | INTRAVENOUS | Status: AC
  Administered 2017-11-17: 19:00:00 via INTRAVENOUS

## 2017-11-17 MED ORDER — SUGAMMADEX SODIUM 200 MG/2ML IV SOLN
INTRAVENOUS | Status: DC | PRN
  Administered 2017-11-17: 156 mg via INTRAVENOUS

## 2017-11-17 MED ORDER — PROMETHAZINE HCL 25 MG/ML IJ SOLN: 12.5000 mg | INTRAMUSCULAR | Status: DC | PRN

## 2017-11-17 MED ORDER — ROCURONIUM BROMIDE 100 MG/10ML IV SOLN
INTRAVENOUS | Status: DC | PRN
  Administered 2017-11-17 (×2): 10 mg via INTRAVENOUS
  Administered 2017-11-17: 40 mg via INTRAVENOUS
  Administered 2017-11-17: 20 mg via INTRAVENOUS
  Administered 2017-11-17 (×3): 10 mg via INTRAVENOUS

## 2017-11-17 MED ORDER — SODIUM CHLORIDE 0.9 % IJ SOLN
INTRAMUSCULAR | Status: AC
  Filled 2017-11-17: qty 100

## 2017-11-17 MED ORDER — PANTOPRAZOLE SODIUM 40 MG IV SOLR
40.0000 mg | Freq: Every day | INTRAVENOUS | Status: DC
  Administered 2017-11-17: 40 mg via INTRAVENOUS
  Filled 2017-11-17: qty 40

## 2017-11-17 MED ORDER — SUGAMMADEX SODIUM 200 MG/2ML IV SOLN
INTRAVENOUS | Status: AC
  Filled 2017-11-17: qty 2

## 2017-11-17 MED ORDER — ENOXAPARIN SODIUM 40 MG/0.4ML ~~LOC~~ SOLN
SUBCUTANEOUS | Status: AC
  Filled 2017-11-17: qty 0.4

## 2017-11-17 MED ORDER — CEFOTETAN DISODIUM-DEXTROSE 2-2.08 GM-%(50ML) IV SOLR
2.0000 g | INTRAVENOUS | Status: AC
  Administered 2017-11-17: 2 g via INTRAVENOUS

## 2017-11-17 MED ORDER — CEFOTETAN DISODIUM-DEXTROSE 2-2.08 GM-%(50ML) IV SOLR
INTRAVENOUS | Status: AC
  Filled 2017-11-17: qty 50

## 2017-11-17 MED ORDER — FENTANYL CITRATE (PF) 100 MCG/2ML IJ SOLN
INTRAMUSCULAR | Status: DC | PRN
  Administered 2017-11-17 (×10): 50 ug via INTRAVENOUS

## 2017-11-17 MED ORDER — VASOPRESSIN 20 UNIT/ML IV SOLN
INTRAVENOUS | Status: AC
  Filled 2017-11-17: qty 1

## 2017-11-17 MED ORDER — KETOROLAC TROMETHAMINE 30 MG/ML IJ SOLN
INTRAMUSCULAR | Status: DC | PRN
  Administered 2017-11-17: 30 mg via INTRAVENOUS

## 2017-11-17 MED ORDER — SUCCINYLCHOLINE CHLORIDE 20 MG/ML IJ SOLN: INTRAMUSCULAR | Status: DC | PRN

## 2017-11-17 MED ORDER — CEFOTETAN DISODIUM-DEXTROSE 2-2.08 GM-%(50ML) IV SOLR
INTRAVENOUS | Status: AC
  Administered 2017-11-17: 2 g via INTRAVENOUS
  Filled 2017-11-17: qty 50

## 2017-11-17 MED ORDER — HYDROMORPHONE HCL 1 MG/ML IJ SOLN
INTRAMUSCULAR | Status: DC | PRN
  Administered 2017-11-17 (×2): 0.5 mg via INTRAVENOUS

## 2017-11-17 SURGICAL SUPPLY — 60 items
APPLICATOR ARISTA FLEXITIP XL (MISCELLANEOUS) ×4 IMPLANT
APPLICATOR COTTON TIP 6IN STRL (MISCELLANEOUS) ×16 IMPLANT
BLADE SURG 10 STRL SS (BLADE) ×20 IMPLANT
CABLE HIGH FREQUENCY MONO STRZ (ELECTRODE) ×4 IMPLANT
COUNTER NEEDLE 1200 MAGNETIC (NEEDLE) ×4 IMPLANT
COVER LIGHT HANDLE  1/PK (MISCELLANEOUS) ×2
COVER LIGHT HANDLE 1/PK (MISCELLANEOUS) ×6 IMPLANT
COVER MAYO STAND STRL (DRAPES) ×4 IMPLANT
DERMABOND ADVANCED (GAUZE/BANDAGES/DRESSINGS) ×1
DERMABOND ADVANCED .7 DNX12 (GAUZE/BANDAGES/DRESSINGS) ×3 IMPLANT
DURAPREP 26ML APPLICATOR (WOUND CARE) ×4 IMPLANT
EXTRT SYSTEM ALEXIS 17CM (MISCELLANEOUS) ×4
GAUZE SPONGE 4X4 16PLY XRAY LF (GAUZE/BANDAGES/DRESSINGS) ×8 IMPLANT
GLOVE BIOGEL PI IND STRL 7.0 (GLOVE) ×18 IMPLANT
GLOVE BIOGEL PI INDICATOR 7.0 (GLOVE) ×6
GLOVE ECLIPSE 6.5 STRL STRAW (GLOVE) ×16 IMPLANT
GOWN STRL REUS W/TWL LRG LVL3 (GOWN DISPOSABLE) ×16 IMPLANT
HEMOSTAT ARISTA ABSORB 3G PWDR (MISCELLANEOUS) ×4 IMPLANT
LEGGING LITHOTOMY PAIR STRL (DRAPES) ×4 IMPLANT
LIGASURE VESSEL 5MM BLUNT TIP (ELECTROSURGICAL) ×4 IMPLANT
MARKER SKIN DUAL TIP RULER LAB (MISCELLANEOUS) ×4 IMPLANT
NEEDLE HYPO 25X1 1.5 SAFETY (NEEDLE) ×4 IMPLANT
NEEDLE INSUFFLATION 120MM (ENDOMECHANICALS) ×4 IMPLANT
NS IRRIG 1000ML POUR BTL (IV SOLUTION) ×4 IMPLANT
OCCLUDER COLPOPNEUMO (BALLOONS) ×4 IMPLANT
PACK LAPAROSCOPY BASIN (CUSTOM PROCEDURE TRAY) ×4 IMPLANT
PACK TRENDGUARD 450 HYBRID PRO (MISCELLANEOUS) ×3 IMPLANT
POUCH LAPAROSCOPIC INSTRUMENT (MISCELLANEOUS) ×4 IMPLANT
PROTECTOR NERVE ULNAR (MISCELLANEOUS) ×8 IMPLANT
SCISSORS LAP 5X35 DISP (ENDOMECHANICALS) ×4 IMPLANT
SCOPETTES 8  STERILE (MISCELLANEOUS) ×1
SCOPETTES 8 STERILE (MISCELLANEOUS) ×3 IMPLANT
SET CYSTO W/LG BORE CLAMP LF (SET/KITS/TRAYS/PACK) ×4 IMPLANT
SET IRRIG TUBING LAPAROSCOPIC (IRRIGATION / IRRIGATOR) ×4 IMPLANT
SET TRI-LUMEN FLTR TB AIRSEAL (TUBING) ×4 IMPLANT
SHEARS HARMONIC ACE PLUS 36CM (ENDOMECHANICALS) ×4 IMPLANT
SPONGE SURGIFOAM ABS GEL 12-7 (HEMOSTASIS) ×4 IMPLANT
SUT VIC AB 0 CT1 27 (SUTURE) ×2
SUT VIC AB 0 CT1 27XBRD ANBCTR (SUTURE) ×6 IMPLANT
SUT VIC AB 2-0 SH 27 (SUTURE) ×1
SUT VIC AB 2-0 SH 27XBRD (SUTURE) ×3 IMPLANT
SUT VICRYL 4-0 PS2 18IN ABS (SUTURE) ×8 IMPLANT
SUT VLOC 180 0 9IN  GS21 (SUTURE) ×2
SUT VLOC 180 0 9IN GS21 (SUTURE) ×6 IMPLANT
SYR 10ML LL (SYRINGE) ×4 IMPLANT
SYR 50ML LL SCALE MARK (SYRINGE) ×8 IMPLANT
SYR 5ML LL (SYRINGE) ×4 IMPLANT
SYSTEM CONTND EXTRCTN KII BLLN (MISCELLANEOUS) ×3 IMPLANT
TIP RUMI ORANGE 6.7MMX12CM (TIP) ×4 IMPLANT
TIP UTERINE 5.1X6CM LAV DISP (MISCELLANEOUS) IMPLANT
TIP UTERINE 6.7X10CM GRN DISP (MISCELLANEOUS) IMPLANT
TIP UTERINE 6.7X6CM WHT DISP (MISCELLANEOUS) IMPLANT
TIP UTERINE 6.7X8CM BLUE DISP (MISCELLANEOUS) IMPLANT
TOWEL OR 17X24 6PK STRL BLUE (TOWEL DISPOSABLE) ×8 IMPLANT
TRENDGUARD 450 HYBRID PRO PACK (MISCELLANEOUS) ×4
TROCAR ADV FIXATION 5X100MM (TROCAR) ×4 IMPLANT
TROCAR PORT AIRSEAL 5X120 (TROCAR) ×4 IMPLANT
TROCAR XCEL NON BLADE 8MM B8LT (ENDOMECHANICALS) ×4 IMPLANT
TROCAR XCEL NON-BLD 5MMX100MML (ENDOMECHANICALS) ×4 IMPLANT
WARMER LAPAROSCOPE (MISCELLANEOUS) ×4 IMPLANT

## 2017-11-17 NOTE — Transfer of Care (Signed)
Immediate Anesthesia Transfer of Care Note  Patient: Rachel Woods  Procedure(s) Performed: TOTAL LAPAROSCOPIC HYSTERECTOMY WITH SALPINGECTOMY WITH CAUTERIZATION OF ENDOMETRIOSIS (Bilateral ) CYSTOSCOPY (N/A ) RIGHT OOPHORECTOMY (Right )  Patient Location: PACU  Anesthesia Type:General  Level of Consciousness: awake, alert  and oriented  Airway & Oxygen Therapy: Patient Spontanous Breathing and Patient connected to nasal cannula oxygen  Post-op Assessment: Report given to RN and Post -op Vital signs reviewed and stable  Post vital signs: Reviewed and stable  Last Vitals:  Vitals:   11/17/17 0621  BP: 135/84  Pulse: 79  Resp: 16  Temp: 36.8 C  SpO2: 99%    Last Pain:  Vitals:   11/17/17 0621  TempSrc: Oral      Patients Stated Pain Goal: 3 (88/82/80 0349)  Complications: No apparent anesthesia complications

## 2017-11-17 NOTE — Anesthesia Procedure Notes (Signed)
Procedure Name: Intubation Date/Time: 11/17/2017 7:41 AM Performed by: Flossie Dibble, CRNA Pre-anesthesia Checklist: Patient identified, Patient being monitored, Timeout performed, Emergency Drugs available and Suction available Patient Re-evaluated:Patient Re-evaluated prior to induction Oxygen Delivery Method: Circle System Utilized Preoxygenation: Pre-oxygenation with 100% oxygen Induction Type: IV induction Ventilation: Mask ventilation without difficulty Laryngoscope Size: Mac and 3 Grade View: Grade I Tube type: Oral Tube size: 7.0 mm Number of attempts: 1 Airway Equipment and Method: stylet Placement Confirmation: ETT inserted through vocal cords under direct vision,  positive ETCO2 and breath sounds checked- equal and bilateral Secured at: 21.5 cm Tube secured with: Tape Dental Injury: Teeth and Oropharynx as per pre-operative assessment

## 2017-11-17 NOTE — Anesthesia Postprocedure Evaluation (Signed)
Anesthesia Post Note  Patient: Rachel Woods  Procedure(s) Performed: TOTAL LAPAROSCOPIC HYSTERECTOMY WITH SALPINGECTOMY WITH CAUTERIZATION OF ENDOMETRIOSIS (Bilateral ) CYSTOSCOPY (N/A ) RIGHT OOPHORECTOMY (Right )     Patient location during evaluation: [facility] Anesthesia Type: General Level of consciousness: awake and alert Pain management: pain level controlled Vital Signs Assessment: post-procedure vital signs reviewed and stable Respiratory status: spontaneous breathing, nonlabored ventilation and respiratory function stable Cardiovascular status: stable Postop Assessment: no apparent nausea or vomiting and adequate PO intake Anesthetic complications: no    Last Vitals:  Vitals:   11/17/17 1415 11/17/17 1520  BP: (!) 120/56 105/63  Pulse: 72 76  Resp: 18 18  Temp: 36.9 C 36.8 C  SpO2: 97% 96%    Last Pain:  Vitals:   11/17/17 1813  TempSrc:   PainSc: 4    Pain Goal: Patients Stated Pain Goal: 3 (11/17/17 1813)               Jabier Mutton

## 2017-11-17 NOTE — Op Note (Addendum)
11/17/2017  12:44 PM  PATIENT:  Rachel Woods  52 y.o. 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  PROCEDURE:  Procedure(s): TOTAL LAPAROSCOPIC HYSTERECTOMY WITH SALPINGECTOMY WITH CAUTERIZATION OF ENDOMETRIOSIS CYSTOSCOPY RIGHT OOPHORECTOMY  SURGEON:  Megan Salon  ASSISTANTS: Sumner Boast, MD   ANESTHESIA:   general  ESTIMATED BLOOD LOSS: 750 mL  BLOOD ADMINISTERED:none   FLUIDS: 2500cc LR  UOP: 250cc, pink tinged after doing the vaginal morcellation but this cleared afterwards and a cystoscopy was performed at the end of the procedure  SPECIMEN:  Uterus, cervix, right fallopian tube and ovary that appears to be a very large ovarian fibroma, left fallopian tube  DISPOSITION OF SPECIMEN:  PATHOLOGY  FINDINGS: grossly enlarged uterus c/w fibroids, normal left ovary and bilateral fallopian tubes, enlarge and very hard right ovary c/w fibroma sent separately  DESCRIPTION OF OPERATION: Patient is taken to the operating room. She is placed in the supine position. She is a running IV in place. Informed consent was present on the chart. SCDs on her lower extremities and functioning properly. Patient was positioned while she was awake.  Her legs were placed in the low lithotomy position in [place]. Her arms were tucked by the side.  General endotracheal anesthesia was administered by the anesthesia staff without difficulty. Dr. Lyndle Herrlich, anesthesia, oversaw case.  Time out performed.    Chlora prep was then used to prep the abdomen and Betadine was used to prep the inner thighs, perineum and vagina. Once 3 minutes had past the patient was draped in a normal standard fashion. The legs were lifted to the high lithotomy position. The cervix was visualized by placing a heavy weighted speculum in the posterior aspect of  the vagina and using a curved Deaver retractor to the retract anteriorly. The anterior lip of the cervix was grasped with single-tooth tenaculum.  The cervix sounded to 13 cm. Pratt dilators were used to dilate the cervix up to a #21. A RUMI uterine manipulator was obtained. A #12 disposable tip was placed on the RUMI manipulator as well as a 3.0, silver KOH ring. This was passed through the cervix and the bulb of the disposable tip was inflated with 10 cc of normal saline. There was a good fit of the KOH ring around the cervix. The tenaculum was removed. There is also moderate manipulation of the uterus but this was primarily due to size of the uterus.  The speculum and retractor were removed as well. A Foley catheter was placed to straight drain.  Clear urine was noted. Legs were lowered to the low lithotomy position and attention was turned the abdomen.  LUQ entry was performed.  Skin was anesthetized with 0.25% Marcaine and 75mm skin incision was made.  With direct visualization of the laparoscopy, the trochar and port placed directly into the LUQ.  An OG tube had been placed by anesthesia prior to placement.  Intraabdominal placement was confirmed.  Pneumoperitoneum was achieved.  A large uterus with multiple large fibroids were noted.  Left ovary was normal and right ovary was significantly enlarged and appeared to be a large fibroma.    Locations for ports were chosen.  These were placed higher than normal with right mid abdominal and left mid abdominal quadrant placement.  Umbilical port was placed as well.  Left port was 44mm.  Umbilical and right sided port were 36mm.  0.25% marcaine was used to anesthetize the skin before skin incisions were made.  All trochars were removed.    Attention was turned to the left side. Ureter was identified.  Decision made to remove two large fundal fibroids that were pedunculate to get them out of the way of visualization.  Pitressin 20u in 100cc NS was mixed.  Base of  fibroids were injected and then fibroids were removed with harmonic scalpel.  Then with uterus on stretch the left tube was excised off the ovary and mesosalpinx was dissected to free the tube. Then the left utero-ovarian pedicle was serially clamped cauterized and incised using the ligasure device. Left round ligament was serially clamped cauterized and incised. The anterior and posterior peritoneum of the inferior leaf of the broad ligament were opened. The beginning of the baldde flap was created.  The bladder was taken down below the level of the KOH ring. The left uterine artery skeletonized and then just superior to the KOH ring this vessel was serially clamped and cauterized.  It was not incised at this point.    Attention was turned the right side.  The tube was excised off the ovary using sharp dissection a bipolar cautery.  The mesosalpinx was incised freeing the tube.  The enlarged right ovary was removed from the uterus by transecting the left uteroovarian pedicle with the Ligasure device.  The ureter could then be seen by lifting the left ovary.  Next the IP ligament was serially clamped, cauterized and incised to free the right ovary.  Then the uterus was placed on stretch to the opposite side.  Next the right round ligament was serially clamped cauterized and incised. The anterior posterior peritoneum of the inferiorly for the broad ligament were opened. The anterior peritoneum was carried across to the dissection on the left side. The remainder of the bladder flap was created using sharp dissection. The bladder was well below the level of the KOH ring. The left uterine artery skeletonized. Then the left uterine artery, above the level of the KOH ring, was serially clamped cauterized and incised. The uterus was devascularized at this point.  The colpotomy was performed a starting in the midline and using a harmonic scalpel with the inferior edge of the open blade  This was carried around a  circumferential fashion until the vaginal mucosa was completely incised in the specimen was freed.    Then the ovary and fibroids were delivered into the vagina.  The ovary did have to be morcellated sharply in the vagina before removal.    Next the uterus was placed in a large Alexis bag.  This was brought down through the vaginal and the uterus was morcellated in the bag until the entire specimen was delivered. Due to the size of the uterus, about 2 hours was spent on the vaginal morcellation.  Then a vaginal occlusive device was used to maintain the pneumoperitoneum.  Instruments were changed with a needle driver and Kobra graspers.  Using a 9 inch V. lock suture, the cuff was closed by incorporating the anterior and posterior vaginal mucosa in each stitch. This was carried across all the way to the left corner and a running fashion. Two stitches were brought back towards the midline and the suture was cut flush with the vagina. The needle was brought out the pelvis. The pelvis was irrigated. All pedicles were inspected. No bleeding was noted. In Interceed was placed across vaginal cuff. Ureters were noted deep in the pelvis to  be peristalsing.  At this point the procedure was completed.  Arista was placed along the cuff.    The remaining instruments were removed.  The ports were removed under direct visualization of the laparoscope except the right port.  Then the airseal was stopped and the pneumoperitoneum was relieved.  The patient was taken out of Trendelenburg positioning.    The skin was then closed with subcuticular stitches of 3-0 Vicryl. The skin was cleansed Dermabond was applied. Attention was then turned the vagina and the cuff was inspected.  Cuff appeared completely intact with the anterior posterior vaginal mucosa was incorporated in each stitch. Scant bleeding was noted with a superficial vaginal laceration.  Gel foam was placed on this.  Also, there was a small amount of bleeding from  a left, sidewall laceration.  Figure of eight suture placed for excellent hemostasis.  The Foley catheter was removed.  Cystoscopy was performed.  No sutures or bladder injuries were noted.  Ureters were noted with normal urine jets from each one was seen.  Entire bladder was visualized.  Foley was left out after the cystoscopic fluid was drained and cystoscope removed.  Sponge, lap, needle, initially counts were correct x2. Patient tolerated the procedure very well. She was awakened from anesthesia, extubated and taken to recovery in stable condition.   Uterus in OR weight 1512gm.  Total surgical time was 4:45 minutes.  COUNTS:  YES  PLAN OF CARE: Transfer to PACU

## 2017-11-17 NOTE — H&P (Signed)
Rachel Woods is an 52 y.o. female G1P1 DWF here for definitive treatment of large fibroid uterus.  She has been on Depo Lupron for three months and has not had any bleeding in that time.  Uterus is smaller but still with sizable fibroids.  Laparoscopic vs abdominal approach has been discussed.  Risks, benefits, alternatives have been discussed.  Pt is here and ready to proceed.  Pertinent Gynecological History: Menses: amenorrhea with depo lupron Contraception: none DES exposure: denies Blood transfusions: none Sexually transmitted diseases: no past history Previous GYN Procedures: none  Last mammogram: normal Date: 10/18 Last pap: normal Date: 8/18 OB History: G1, P1   Menstrual History: No LMP recorded. Patient has had an injection.    Past Medical History:  Diagnosis Date  . ADHD   . Diverticulosis 2018  . Fibroids 2018  . GERD (gastroesophageal reflux disease)   . Headache    otc med prn  . Hyperlipidemia    diet controlled - no meds  . Reflux esophagitis 2018  . SVD (spontaneous vaginal delivery)    x 1    Past Surgical History:  Procedure Laterality Date  . COLONOSCOPY     polyps  . DENTAL SURGERY     root canal w/deep cleanings- general anesthesia  . UPPER GI ENDOSCOPY    . WISDOM TOOTH EXTRACTION      Family History  Problem Relation Age of Onset  . Hypertension Mother   . Dementia Mother   . Fibroids Mother   . Heart disease Mother   . Hyperlipidemia Mother   . Stroke Mother        TIAs  . Thyroid disease Mother   . Glaucoma Father   . Diabetes Father   . Hypertension Father   . Heart disease Father   . Hyperlipidemia Father   . Heart disease Maternal Grandfather   . Heart disease Paternal Grandmother     Social History:  reports that she has been smoking cigarettes.  She has smoked for the past 10.00 years. she has never used smokeless tobacco. She reports that she drinks alcohol. She reports that she does not use drugs.  Allergies: No  Known Allergies  Medications Prior to Admission  Medication Sig Dispense Refill Last Dose  . Aspirin-Acetaminophen-Caffeine (EXCEDRIN PO) Take 2 tablets by mouth 2 (two) times daily as needed (headaches).    Past Month at Unknown time  . Cholecalciferol (VITAMIN D3) 5000 units CAPS Take 5,000 Units by mouth daily.    Past Month at Unknown time  . ibuprofen (ADVIL,MOTRIN) 200 MG tablet Take 800 mg by mouth 2 (two) times daily as needed for headache or moderate pain.    Past Month at Unknown time  . Ibuprofen-Diphenhydramine HCl (ADVIL PM) 200-25 MG CAPS Take 4 tablets by mouth at bedtime as needed (sleep).   Past Month at Unknown time  . omeprazole (PRILOSEC) 20 MG capsule Take 20 mg by mouth daily.   11/17/2017 at 0530    Review of Systems  All other systems reviewed and are negative.   Blood pressure 135/84, pulse 79, temperature 98.2 F (36.8 C), temperature source Oral, resp. rate 16, SpO2 99 %. Physical Exam  Constitutional: She is oriented to person, place, and time. She appears well-developed and well-nourished.  Cardiovascular: Normal rate and regular rhythm.  Respiratory: Effort normal and breath sounds normal.  Neurological: She is alert and oriented to person, place, and time.  Skin: Skin is warm and dry.  Psychiatric: She  has a normal mood and affect.    Results for orders placed or performed during the hospital encounter of 11/17/17 (from the past 24 hour(s))  hCG, serum, qualitative     Status: None   Collection Time: 11/17/17  6:12 AM  Result Value Ref Range   Preg, Serum NEGATIVE NEGATIVE    No results found.  Assessment/Plan: 52 yo G1P1 DWF here for definitive treatment of uterine fibroids.  All questions answered.  She is ready to proceed today.  Megan Salon 11/17/2017, 7:09 AM

## 2017-11-17 NOTE — Progress Notes (Signed)
Day of Surgery Procedure(s) (LRB): TOTAL LAPAROSCOPIC HYSTERECTOMY WITH SALPINGECTOMY WITH CAUTERIZATION OF ENDOMETRIOSIS (Bilateral) CYSTOSCOPY (N/A) RIGHT OOPHORECTOMY (Right)  Subjective: Patient reports an episode of emesis earlier this evening.  Feels fine now.  No nausea.  Has good pain control.  Voided x 1 with 100cc--was concentrated.  Minimal spotting.  Has walked in the hall x 1.  Objective: I have reviewed patient's vital signs, intake and output, medications and labs.  General: alert and cooperative Resp: clear to auscultation bilaterally Cardio: regular rate and rhythm, S1, S2 normal, no murmur, click, rub or gallop GI: soft, appropriately tender, quiet Extremities: extremities normal, atraumatic, no cyanosis or edema Vaginal Bleeding: minimal  Inc:  C/D/I  Assessment: s/p Procedure(s) with comments: TOTAL LAPAROSCOPIC HYSTERECTOMY WITH SALPINGECTOMY WITH CAUTERIZATION OF ENDOMETRIOSIS (Bilateral) - possible BSO/ large uterus/ need 5 hours OR time CYSTOSCOPY (N/A) RIGHT OOPHORECTOMY (Right): stable and progressing appropriately  Plan: Fluid bolus now  Repeat CBC in AM Plan to repeat Lovenox if hb stable Advised pt to advance diet slowly due to minimal BS  LOS: 0 days    Megan Salon 11/17/2017, 6:59 PM

## 2017-11-18 ENCOUNTER — Encounter (HOSPITAL_COMMUNITY): Payer: Self-pay | Admitting: Obstetrics & Gynecology

## 2017-11-18 DIAGNOSIS — N809 Endometriosis, unspecified: Secondary | ICD-10-CM

## 2017-11-18 DIAGNOSIS — D259 Leiomyoma of uterus, unspecified: Secondary | ICD-10-CM | POA: Diagnosis not present

## 2017-11-18 LAB — CBC
HCT: 30.3 % — ABNORMAL LOW (ref 36.0–46.0)
HEMOGLOBIN: 10.6 g/dL — AB (ref 12.0–15.0)
MCH: 28.6 pg (ref 26.0–34.0)
MCHC: 35 g/dL (ref 30.0–36.0)
MCV: 81.9 fL (ref 78.0–100.0)
PLATELETS: 309 10*3/uL (ref 150–400)
RBC: 3.7 MIL/uL — AB (ref 3.87–5.11)
RDW: 14.4 % (ref 11.5–15.5)
WBC: 12.1 10*3/uL — ABNORMAL HIGH (ref 4.0–10.5)

## 2017-11-18 MED ORDER — HYDROCODONE-ACETAMINOPHEN 5-325 MG PO TABS: 1.0000 | ORAL_TABLET | Freq: Four times a day (QID) | ORAL | 0 refills | Status: DC | PRN

## 2017-11-18 MED ORDER — IBUPROFEN 800 MG PO TABS: 800.0000 mg | ORAL_TABLET | Freq: Three times a day (TID) | ORAL | 0 refills | Status: DC | PRN

## 2017-11-18 NOTE — Progress Notes (Signed)
1 Day Post-Op Procedure(s) (LRB): TOTAL LAPAROSCOPIC HYSTERECTOMY WITH SALPINGECTOMY WITH CAUTERIZATION OF ENDOMETRIOSIS (Bilateral) CYSTOSCOPY (N/A) RIGHT OOPHORECTOMY (Right)  Subjective: Patient reports excellent pain control.  No nausea.  Has eaten.  Voided easily.  Walked the halls.  Ready to go home.  Objective: I have reviewed patient's vital signs, intake and output, medications and labs. UOP 1900cc  Vitals:   11/17/17 2352 11/18/17 0415  BP: (!) 109/51 (!) 107/59  Pulse: 67 60  Resp: 18 15  Temp: 97.9 F (36.6 C) 98.5 F (36.9 C)  SpO2: 96% 96%   General: alert and cooperative Resp: clear to auscultation bilaterally Cardio: regular rate and rhythm, S1, S2 normal, no murmur, click, rub or gallop GI: soft, non-tender; bowel sounds normal; no masses,  no organomegaly and incision: clean, dry and intact Extremities: extremities normal, atraumatic, no cyanosis or edema Vaginal Bleeding: none  Assessment: s/p Procedure(s) with comments: TOTAL LAPAROSCOPIC HYSTERECTOMY WITH SALPINGECTOMY WITH CAUTERIZATION OF ENDOMETRIOSIS (Bilateral) - possible BSO/ large uterus/ need 5 hours OR time CYSTOSCOPY (N/A) RIGHT OOPHORECTOMY (Right): stable and progressing well  Plan: Discharge home  LOS: 0 days    Megan Salon 11/18/2017, 7:06 AM

## 2017-11-18 NOTE — Progress Notes (Signed)

## 2017-11-18 NOTE — Discharge Instructions (Signed)

## 2017-11-18 NOTE — Discharge Summary (Signed)
Physician Discharge Summary  Patient ID: Rachel Woods MRN: [id number] DOB/AGE: 51/18/1967 51 y.o.  Admit date: 11/17/2017 Discharge date: 11/18/2017  Admission Diagnoses:  Enlarged fibroid uterus, umbilical bleeding  Discharge Diagnoses:  Active Problems:   Endometriosis Fibroid uterus  Discharged Condition: good  Hospital Course: Patient admitted through same day surgery.  She was taken to OR where TLH/RSO/left salpingectomy/cystoscopy were performed.  Surgical findings included grossly enlarged uterus, right ovarian mass c/w fibroma/normal left ovary.  Surgery was close to five hours with a significant portion of this required for morcellation of the uterus which was done in an [place] bag and in the vagina.  EBL 750cc.  Foley catheter was removed before leaving OR.  Patient transferred to PACU where she was stable and then to 3rd floor for the remainder of her hospitalization.  During her post-op recovery, her vitals and stable and she was AF.  In evening of POD#0, she did have some nausea and had emesis x 1.  Six hour post op hb was 11.3.  UOP was a little low and fluid bolus was given.  UOP picked up quickly after this bolus and was excellent.  Nausea resolved and she was able to transition to oral pain medications and regular diet.  She was able to ambulate and she had good pain control.  Patient seen both in the evening of POD#0 and AM of POD#1.  In the AM of POD#1, she was without complaint. Repeat hemoglobin was obtained in post op day #1.  Repeat hemoglobin was 10.6.  At this point, patient was voiding, walking, having excellent pain control, had no nausea, and no vaginal bleeding.  I felt discharge home was appropriate.  Consults: None  Significant Diagnostic Studies: labs: post op hb on POD #1 was 10.6  Treatments: surgery: TLH/RSO/left salpingectomy, cystoscopy  Discharge Exam: Blood pressure (!) 107/59, pulse 60, temperature 98.5 F (36.9 C), temperature source Oral, resp.  rate 15, height 5' 2.99" (1.6 m), weight 172 lb (78 kg), SpO2 96 %. General appearance: alert and cooperative Resp: clear to auscultation bilaterally Cardio: regular rate and rhythm, S1, S2 normal, no murmur, click, rub or gallop GI: soft, non-tender; bowel sounds normal; no masses,  no organomegaly Extremities: extremities normal, atraumatic, no cyanosis or edema Incision/Wound: C/D/I  Disposition: Final discharge disposition not confirmed   Allergies as of 11/18/2017   No Known Allergies     Medication List    TAKE these medications   ADVIL PM 200-25 MG Caps Generic drug:  Ibuprofen-Diphenhydramine HCl Take 4 tablets by mouth at bedtime as needed (sleep).   EXCEDRIN PO Take 2 tablets by mouth 2 (two) times daily as needed (headaches).   HYDROcodone-acetaminophen 5-325 MG tablet Commonly known as:  NORCO/VICODIN Take 1-2 tablets by mouth every 6 (six) hours as needed for moderate pain or severe pain.   ibuprofen 800 MG tablet Commonly known as:  ADVIL,MOTRIN Take 1 tablet (800 mg total) by mouth every 8 (eight) hours as needed for headache or moderate pain. What changed:    medication strength  when to take this   omeprazole 20 MG capsule Commonly known as:  PRILOSEC Take 20 mg by mouth daily.   Vitamin D3 5000 units Caps Take 5,000 Units by mouth daily.      Follow-up Information    Megan Salon, MD On 11/23/2017.   Specialty:  Gynecology Why:  2:30pm Contact information: [address] [place] [address] [phone]  Signed: Megan Salon 11/18/2017, 7:15 AM

## 2017-11-20 ENCOUNTER — Encounter: Payer: Self-pay | Admitting: Obstetrics & Gynecology

## 2017-11-20 NOTE — Progress Notes (Signed)
CC:   Repeat ultrasound and discuss plans for care  HPI: 52 y.o. G1P1 Divorced Caucasian female here for repeat ultrasound after being on Depo Provera for the past 3 months.  Pt is not having any bleeding.  She has been seeing Dr. Quincy Simmonds and want sot make plans for surgery.  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  Feels that her uterus has gotten smaller and is having less pressure symptoms.  Energy is good.  Endometrial biopsy 08/03/17 was negative.    Has hx of umbilical drainage with cycle but has not had any since being on Depo Provera.  GYNECOLOGIC HISTORY: Patient's last menstrual period was 08/19/2017. Contraception: none Menopausal hormone therapy: n/a      Patient Active Problem List   Diagnosis Date Noted  . Abnormal abdominal exam 06/08/2017  . Health care maintenance 03/11/2017  . Poor concentration 03/11/2017        Past Medical History:  Diagnosis Date  . ADHD   . Diverticulosis 2018  . Fibroids 2018  . Reflux esophagitis 2018         Past Surgical History:  Procedure Laterality Date  . DENTAL SURGERY    . WISDOM TOOTH EXTRACTION      MEDS:         Current Outpatient Medications on File Prior to Visit  Medication Sig Dispense Refill  . Aspirin-Acetaminophen-Caffeine (EXCEDRIN PO) Take by mouth as needed.    . Cholecalciferol (VITAMIN D3) 5000 units CAPS Take 1 capsule by mouth daily.    Marland Kitchen ibuprofen (ADVIL,MOTRIN) 200 MG tablet Take 200 mg by mouth as needed.    Marland Kitchen OMEPRAZOLE PO Take 20 mg by mouth daily.     No current facility-administered medications on file prior to visit.     ALLERGIES: Patient has no known allergies.       Family History  Problem Relation Age of Onset  . Hypertension Mother   . Dementia Mother   . Fibroids Mother   . Heart disease Mother   . Hyperlipidemia Mother   . Stroke Mother        TIAs  . Thyroid disease Mother   . Glaucoma  Father   . Diabetes Father   . Hypertension Father   . Heart disease Father   . Hyperlipidemia Father   . Heart disease Maternal Grandfather   . Heart disease Paternal Grandmother     SH:  Divorced, non smoker  Review of Systems  Gastrointestinal:       Abdominal mass  All other systems reviewed and are negative.   PHYSICAL EXAMINATION:    BP (!) 146/80 (BP Location: Right Arm, Patient Position: Sitting, Cuff Size: Normal)   Pulse 80   Resp 16   Ht 5' 3.5" (1.613 m)   Wt 172 lb (78 kg)   LMP 08/19/2017   BMI 29.99 kg/m     General appearance: alert, cooperative and appears stated age CV:  Regular rate and rhythm Lungs:  clear to auscultation, no wheezes, rales or rhonchi, symmetric air entry Abdomen: soft, non-tender; bowel sounds normal; abdominal mass up to umbilicus and fills pelvis, mobile, no organomegaly  Pelvic: External genitalia:  no lesions              Urethra:  normal appearing urethra with no masses, tenderness or lesions              Bartholins and Skenes: normal  Vagina: normal appearing vagina with normal color and discharge, no lesions              Cervix: no lesions              Bimanual Exam:  Uterus:  enlarged, 20 weeks size              Adnexa: no mass, fullness, tenderness, difficult to palpate.              Anus:  no lesions  Ultrasound: Uterus:  17.4 x 13.1 x 10.5cm with multiple fibroids measuring 7cm, 7cm, 7cm, and 5.5cm Endometrium:  4.65mm Left ovary:  4 x 1.6 x 5.0PT with 4.6FK follicle noted Right ovary:  Not seen Cul de sac:  No free fluid  Chaperone was present for exam.  Findings reviewed.  Pt desires to proceed with hysterectomy at this time.  D/w pt laparoscopic versus open procedure.  She does not care which procedure is done as long as the hysterectomy is now scheduled.  She is aware that Dr. Quincy Simmonds may not be available for this procedure and she is ok with this.    Procedure discussed with  patient.  Hospital stay, recovery and pain management all discussed.  Risks discussed including but not limited to bleeding, 1% risk of receiving a  transfusion, infection, 3-4% risk of bowel/bladder/ureteral/vascular injury discussed as well as possible need for additional surgery if injury does occur discussed.  DVT/PE and rare risk of death discussed.  My actual complications with prior surgeries discussed.  Vaginal cuff dehiscence discussed.  Hernia formation discussed.  Positioning and incision locations discussed.  Patient aware if pathology abnormal she may need additional treatment.  All questions answered.    Assessment: Enlarged fibroid uterus, s/p 3 months of Depo Lupon with decrease in size of uterus  Plan: Will proceed with scheduling surgery at this time.  Pre-op prep discussed.   ~30 minutes spent with patient >50% of time was in face to face discussion of above.

## 2017-11-20 NOTE — Progress Notes (Unsigned)
Patient ID: Rachel Woods, female   DOB: [birth year], 52 y.o.   MRN: [id number]

## 2017-11-23 ENCOUNTER — Ambulatory Visit (INDEPENDENT_AMBULATORY_CARE_PROVIDER_SITE_OTHER): Payer: BC Managed Care – PPO | Admitting: Obstetrics & Gynecology

## 2017-11-23 ENCOUNTER — Other Ambulatory Visit: Payer: Self-pay

## 2017-11-23 ENCOUNTER — Encounter: Payer: Self-pay | Admitting: Obstetrics & Gynecology

## 2017-11-23 VITALS — BP 136/80 | HR 68 | Resp 16 | Wt 166.0 lb

## 2017-11-23 DIAGNOSIS — Z9889 Other specified postprocedural states: Secondary | ICD-10-CM

## 2017-11-23 MED ORDER — CLOTRIMAZOLE 10 MG MT TROC: 10.0000 mg | Freq: Every day | OROMUCOSAL | 0 refills | Status: DC

## 2017-11-23 NOTE — Progress Notes (Signed)
Post Operative Visit  Procedure: Total laparoscopic hysterectomy with Salpingectomy with cauterization of endometriosis, cystoscopy, right oophorectomy Days Post-op: 6   Subjective: Doing well.  Having some spotting.  This started after climbing into a big truck.  Having regular bowel movements.  Emptying bladder normally.  Feeling fatigued--which she knew was common.  Reports redness and thickness on her tongue.    Objective: BP 136/80 (BP Location: Right Arm, Patient Position: Sitting, Cuff Size: Normal)   Pulse 68   Resp 16   Wt 166 lb (75.3 kg)   BMI 29.41 kg/m   EXAM General: alert, cooperative and no distress Resp: clear to auscultation bilaterally Cardio: regular rate and rhythm, S1, S2 normal, no murmur, click, rub or gallop GI: soft, non-tender; bowel sounds normal; no masses,  no organomegaly and incision: clean, dry and intact Extremities: extremities normal, atraumatic, no cyanosis or edema Vaginal Bleeding: none  Throat:  Thick white material on tongue, erythema on oropharynx  Assessment: s/p TLH/RSO, left salpingectomy, cystoscopy Oral thrush  Plan: Recheck 5 weeks Clotrimazole troches 10mg  five times daily x 7 days.  #35/0RF.

## 2017-12-16 NOTE — Telephone Encounter (Signed)
Patient's surgery was performed on 11/17/2017. Encounter closed.

## 2017-12-28 ENCOUNTER — Encounter: Payer: Self-pay | Admitting: Obstetrics & Gynecology

## 2017-12-28 ENCOUNTER — Ambulatory Visit (INDEPENDENT_AMBULATORY_CARE_PROVIDER_SITE_OTHER): Payer: BC Managed Care – PPO | Admitting: Obstetrics & Gynecology

## 2017-12-28 ENCOUNTER — Other Ambulatory Visit: Payer: Self-pay

## 2017-12-28 VITALS — BP 126/70 | HR 80 | Resp 16 | Wt 166.6 lb

## 2017-12-28 DIAGNOSIS — R002 Palpitations: Secondary | ICD-10-CM

## 2017-12-28 DIAGNOSIS — Z9889 Other specified postprocedural states: Secondary | ICD-10-CM

## 2017-12-28 NOTE — Progress Notes (Signed)
Post Operative Visit  Procedure: Total Laparoscopic Hysterectomy with salpingectomy with cauterization of endometriosis, cystoscopy, right oophorectomy.  Days Post-op: 41  Subjective: Doing well.  Denies vaginal bleeding.  Energy is getting better but fatigue has been worse than she expected.  Bowel movements are normal.  Emptying bladder easily.  Going back to school on Monday.  Would like note for this until then.  Taking no pain medications--never really did.  So pleased with how she feels.    She has noticed several occurences of what felt like palpitations.  She will have the feeling of her heart racing for several seconds.  Almost makes her stop because the palpitations feel so abnormal.  Does not have lightheadedness or SOB when this happens.  She has not discovered any particular triggers.  It can happen with activity or with rest.  She does have a significant family history of cardiovascular disease in both parents and in grandparents as well.  States she does not want to overreact but reminded patient that she had an almost 20-week size uterus before she decided to seek care.  I think she is quite reasonable and referral at this time would be appropriate.  Objective: BP 126/70 (BP Location: Right Arm, Patient Position: Sitting, Cuff Size: Normal)   Pulse 80   Resp 16   Wt 166 lb 9.6 oz (75.6 kg)   LMP 08/19/2017   BMI 29.52 kg/m   EXAM General: alert and no distress Resp: clear to auscultation bilaterally Cardio: regular rate and rhythm, S1, S2 normal, no murmur, click, rub or gallop GI: soft, non-tender; bowel sounds normal; no masses,  no organomegaly and incision: clean, dry and intact Extremities: extremities normal, atraumatic, no cyanosis or edema Vaginal Bleeding: none  Gyn:  NAEFG, vagina pink and without lesion,   Assessment: S/p TLH/bilateral salpingectomy/cystoscopy with enlarged uterus and multiple fibroids Palpitations versus tachycardia  Plan: Recheck 1  year Precautions given for pelvic rest for 8 full weeks post op Lifting restrictions for 12 weeks discussed but highly encouraged pt to not do more than 20 pounds of lifting in the future. Referral to cardiology is made.  Patient is aware she may need to have a Holter monitor placed and would likely have some cardiac imaging studies performed.  Will await recommendations from cardiology.

## 2018-01-14 ENCOUNTER — Encounter: Payer: Self-pay | Admitting: Cardiology

## 2018-01-14 ENCOUNTER — Ambulatory Visit (INDEPENDENT_AMBULATORY_CARE_PROVIDER_SITE_OTHER): Payer: BC Managed Care – PPO | Admitting: Cardiology

## 2018-01-14 VITALS — BP 110/72 | HR 82 | Ht 63.0 in | Wt 166.6 lb

## 2018-01-14 DIAGNOSIS — E785 Hyperlipidemia, unspecified: Secondary | ICD-10-CM | POA: Diagnosis not present

## 2018-01-14 DIAGNOSIS — R072 Precordial pain: Secondary | ICD-10-CM | POA: Diagnosis not present

## 2018-01-14 DIAGNOSIS — R002 Palpitations: Secondary | ICD-10-CM | POA: Diagnosis not present

## 2018-01-14 DIAGNOSIS — Z8249 Family history of ischemic heart disease and other diseases of the circulatory system: Secondary | ICD-10-CM | POA: Diagnosis not present

## 2018-01-14 LAB — LIPID PANEL
Chol/HDL Ratio: 5.2 ratio — ABNORMAL HIGH (ref 0.0–4.4)
Cholesterol, Total: 191 mg/dL (ref 100–199)
HDL: 37 mg/dL — ABNORMAL LOW (ref >39)
LDL Calculated: 118 mg/dL — ABNORMAL HIGH (ref 0–99)
Triglycerides: 178 mg/dL — ABNORMAL HIGH (ref 0–149)
VLDL Cholesterol Cal: 36 mg/dL (ref 5–40)

## 2018-01-14 LAB — COMPREHENSIVE METABOLIC PANEL
ALT: 23 IU/L (ref 0–32)
AST: 19 IU/L (ref 0–40)
Albumin/Globulin Ratio: 1.5 (ref 1.2–2.2)
Albumin: 4.4 g/dL (ref 3.5–5.5)
Alkaline Phosphatase: 69 IU/L (ref 39–117)
BUN/Creatinine Ratio: 15 (ref 9–23)
BUN: 11 mg/dL (ref 6–24)
Bilirubin Total: 0.5 mg/dL (ref 0.0–1.2)
CO2: 24 mmol/L (ref 20–29)
Calcium: 9.5 mg/dL (ref 8.7–10.2)
Chloride: 101 mmol/L (ref 96–106)
Creatinine, Ser: 0.71 mg/dL (ref 0.57–1.00)
GFR calc Af Amer: 115 mL/min/{1.73_m2} (ref >59)
GFR calc non Af Amer: 100 mL/min/{1.73_m2} (ref >59)
Globulin, Total: 2.9 g/dL (ref 1.5–4.5)
Glucose: 100 mg/dL — ABNORMAL HIGH (ref 65–99)
Potassium: 4.4 mmol/L (ref 3.5–5.2)
Sodium: 139 mmol/L (ref 134–144)
Total Protein: 7.3 g/dL (ref 6.0–8.5)

## 2018-01-14 LAB — TSH: TSH: 1.07 u[IU]/mL (ref 0.450–4.500)

## 2018-01-14 MED ORDER — METOPROLOL TARTRATE 50 MG PO TABS: 50.0000 mg | ORAL_TABLET | Freq: Once | ORAL | 0 refills | Status: DC

## 2018-01-14 NOTE — Progress Notes (Signed)
Cardiology Office Note    Date:  01/14/2018   ID:  Rachel Woods, DOB 1965-11-10, MRN [id number]  PCP:  Esaw Grandchild, NP  Cardiologist:   Ena Dawley, MD   Chief complaint: hest pain  History of Present Illness:  Rachel Woods is a 52 y.o. female with prior medical history of untreated hyperlipidemia, significant family history of premature coronary artery disease was coming with concern of chest pain she has noticed that in the last year it happens on exertion but also at rest and it feels like a chest tightness feeling of impending doom and shortness of breath. This can last for about 5 minutes. She denies any dizziness. She does have palpitations that would last up to 30 seconds but only happen approximately once in 2 months. Her family history is significant for premature coronary artery disease, on her father's side 13 siblings all of them had coronary artery disease sometimes starting in their 60s and 76s. Her mother had stroke and hypertension, grandfather on mother's side had myocardial infarction at age of 48. She's never been treated for hyperlipidemia however she hasn't been to Dr.for the last 18 years.she underwent hysterectomy in January 2019, she she was having periods all the way to that.Hysterectomy performed for significant fibroids.  Past Medical History:  Diagnosis Date  . ADHD   . Diverticulosis 2018  . Fibroids 2018  . GERD (gastroesophageal reflux disease)   . Headache    otc med prn  . Hyperlipidemia    diet controlled - no meds  . Reflux esophagitis 2018  . SVD (spontaneous vaginal delivery)    x 1   Past Surgical History:  Procedure Laterality Date  . COLONOSCOPY     polyps  . CYSTOSCOPY N/A 11/17/2017   Procedure: CYSTOSCOPY;  Surgeon: Megan Salon, MD;  Location: [facility];  Service: Gynecology;  Laterality: N/A;  . DENTAL SURGERY     root canal w/deep cleanings- general anesthesia  . SALPINGOOPHORECTOMY Right 11/17/2017   Procedure:  RIGHT OOPHORECTOMY;  Surgeon: Megan Salon, MD;  Location: [facility];  Service: Gynecology;  Laterality: Right;  . TOTAL LAPAROSCOPIC HYSTERECTOMY WITH SALPINGECTOMY Bilateral 11/17/2017   Procedure: TOTAL LAPAROSCOPIC HYSTERECTOMY WITH SALPINGECTOMY WITH CAUTERIZATION OF ENDOMETRIOSIS;  Surgeon: Megan Salon, MD;  Location: [facility];  Service: Gynecology;  Laterality: Bilateral;  possible BSO/ large uterus/ need 5 hours OR time  . UPPER GI ENDOSCOPY    . WISDOM TOOTH EXTRACTION      Current Medications: Outpatient Medications Prior to Visit  Medication Sig Dispense Refill  . Aspirin-Acetaminophen-Caffeine (EXCEDRIN PO) Take 2 tablets by mouth 2 (two) times daily as needed (headaches).     . Cholecalciferol (VITAMIN D3) 5000 units CAPS Take 5,000 Units by mouth daily.     Marland Kitchen ibuprofen (ADVIL,MOTRIN) 800 MG tablet Take 1 tablet (800 mg total) by mouth every 8 (eight) hours as needed for headache or moderate pain. 30 tablet 0  . Ibuprofen-Diphenhydramine HCl (ADVIL PM) 200-25 MG CAPS Take 4 tablets by mouth at bedtime as needed (sleep).    Marland Kitchen omeprazole (PRILOSEC) 40 MG capsule Take 40 mg by mouth daily.    Marland Kitchen omeprazole (PRILOSEC) 20 MG capsule Take 20 mg by mouth daily.     No facility-administered medications prior to visit.      Allergies:   Patient has no known allergies.   Social History   Socioeconomic History  . Marital status: Divorced    Spouse name: Not  on file  . Number of children: 1  . Years of education: Not on file  . Highest education level: Not on file  Occupational History  . Occupation: Teacher    Comment: [employer]  Social Needs  . Financial resource strain: Not on file  . Food insecurity:    Worry: Not on file    Inability: Not on file  . Transportation needs:    Medical: Not on file    Non-medical: Not on file  Tobacco Use  . Smoking status: Former Smoker    Years: 10.00    Types: Cigarettes  . Smokeless tobacco: Never Used  . Tobacco comment:  occasional smoker - not every week  Substance and Sexual Activity  . Alcohol use: Yes    Comment: socially  . Drug use: No  . Sexual activity: Yes    Partners: Male    Birth control/protection: None  Lifestyle  . Physical activity:    Days per week: Not on file    Minutes per session: Not on file  . Stress: Not on file  Relationships  . Social connections:    Talks on phone: Not on file    Gets together: Not on file    Attends religious service: Not on file    Active member of club or organization: Not on file    Attends meetings of clubs or organizations: Not on file    Relationship status: Not on file  Other Topics Concern  . Not on file  Social History Narrative  . Not on file     Family History:  The patient's family history includes Dementia in her mother; Diabetes in her father; Fibroids in her mother; Glaucoma in her father; Heart disease in her father, maternal grandfather, mother, and paternal grandmother; Hyperlipidemia in her father and mother; Hypertension in her father and mother; Stroke in her mother; Thyroid disease in her mother.   ROS:   Please see the history of present illness.    ROS All other systems reviewed and are negative.   PHYSICAL EXAM:   VS:  BP 110/72 (BP Location: Left Arm, Patient Position: Sitting, Cuff Size: Normal)   Pulse 82   Ht 5\' 3"  (1.6 m)   Wt 166 lb 9.6 oz (75.6 kg)   LMP 08/19/2017   SpO2 99%   BMI 29.51 kg/m    GEN: Well nourished, well developed, in no acute distress  HEENT: normal  Neck: no JVD, carotid bruits, or masses Cardiac: RRR; no murmurs, rubs, or gallops,no edema  Respiratory:  clear to auscultation bilaterally, normal work of breathing GI: soft, nontender, nondistended, + BS MS: no deformity or atrophy  Skin: warm and dry, no rash Neuro:  Alert and Oriented x 3, Strength and sensation are intact Psych: euthymic mood, full affect  Wt Readings from Last 3 Encounters:  01/14/18 166 lb 9.6 oz (75.6 kg)    12/28/17 166 lb 9.6 oz (75.6 kg)  11/23/17 166 lb (75.3 kg)      Studies/Labs Reviewed:   EKG:  EKG is ordered today.  The ekg ordered today demonstrates normal sinus rhythm with negative T waves in lateral leads suspicious for ischemia, there is no prior EKG available for comparison.  Recent Labs: 04/09/2017: ALT 20; BUN 11; Creatinine, Ser 0.78; Potassium 4.6; Sodium 141; TSH 1.450 11/18/2017: Hemoglobin 10.6; Platelets 309   Lipid Panel    Component Value Date/Time   CHOL 177 04/09/2017 0954   TRIG 134 04/09/2017 0954  HDL 39 (L) 04/09/2017 0954   CHOLHDL 4.5 (H) 04/09/2017 0954   LDLCALC 111 (H) 04/09/2017 8182   Additional studies/ records that were reviewed today include:    ASSESSMENT:    1. Precordial pain   2. Family history of early CAD   3. Hyperlipidemia, unspecified hyperlipidemia type   4. Heart palpitations    PLAN:  In order of problems listed above:  1. Precordial pain,with some typical features, risk factors include her strong family history of premature coronary artery disease and untreated hyperlipidemia, we will order coronary CTA for further evaluation. 2. Hyperlipidemia - will obtain LFTs and lipids today, we'll start lipid management based on results and also results of coronary CTA. 3. Palpitations, difficult to diagnose as they only happen once in 2 months,I will try to see the size of her left atrium on her coronary CTAand possibly order an echocardiogram if abnormal.  Medication Adjustments/Labs and Tests Ordered: Current medicines are reviewed at length with the patient today.  Concerns regarding medicines are outlined above.  Medication changes, Labs and Tests ordered today are listed in the Patient Instructions below. Patient Instructions  Medication Instructions:   Your physician recommends that you continue on your current medications as directed. Please refer to the Current Medication list given to you today.   Labwork:  TODAY--CMET,  TSH, AND LIPIDS    Testing/Procedures:   CORONARY CT Please arrive at the [facility] main entrance of [facility] at xx:xx AM (30-45 minutes prior to test start time)  [facility] [address] [place], [place] [address] [phone]  Proceed to the [facility] Radiology Department (First Floor).  Please follow these instructions carefully (unless otherwise directed):   On the Night Before the Test: . Drink plenty of water. . Do not consume any caffeinated/decaffeinated beverages or chocolate 12 hours prior to your test. . Do not take any antihistamines 12 hours prior to your test.   On the Day of the Test: . Drink plenty of water. Do not drink any water within one hour of the test. . Do not eat any food 4 hours prior to the test. . You may take your regular medications prior to the test. . IF NOT ON A BETA BLOCKER - Take 50 mg of lopressor (metoprolol) one hour before the test.   After the Test: . Drink plenty of water. . After receiving IV contrast, you may experience a mild flushed feeling. This is normal. . On occasion, you may experience a mild rash up to 24 hours after the test. This is not dangerous. If this occurs, you can take Benadryl 25 mg and increase your fluid intake. . If you experience trouble breathing, this can be serious. If it is severe call 911 IMMEDIATELY. If it is mild, please call our office.    Follow-Up:  3 MONTHS WITH DR Meda Coffee       If you need a refill on your cardiac medications before your next appointment, please call your pharmacy.      Signed, Ena Dawley, MD  01/14/2018 1:22 PM    [facility] [place], [place], [place]  [address] Phone: [phone]; Fax: [phone]

## 2018-01-14 NOTE — Patient Instructions (Signed)
Medication Instructions:   Your physician recommends that you continue on your current medications as directed. Please refer to the Current Medication list given to you today.   Labwork:  TODAY--CMET, TSH, AND LIPIDS    Testing/Procedures:   CORONARY CT Please arrive at the [facility] main entrance of [facility] at xx:xx AM (30-45 minutes prior to test start time)  [facility] [address] [place], [place] [address] [phone]  Proceed to the [facility] Radiology Department (First Floor).  Please follow these instructions carefully (unless otherwise directed):   On the Night Before the Test: . Drink plenty of water. . Do not consume any caffeinated/decaffeinated beverages or chocolate 12 hours prior to your test. . Do not take any antihistamines 12 hours prior to your test.   On the Day of the Test: . Drink plenty of water. Do not drink any water within one hour of the test. . Do not eat any food 4 hours prior to the test. . You may take your regular medications prior to the test. . IF NOT ON A BETA BLOCKER - Take 50 mg of lopressor (metoprolol) one hour before the test.   After the Test: . Drink plenty of water. . After receiving IV contrast, you may experience a mild flushed feeling. This is normal. . On occasion, you may experience a mild rash up to 24 hours after the test. This is not dangerous. If this occurs, you can take Benadryl 25 mg and increase your fluid intake. . If you experience trouble breathing, this can be serious. If it is severe call 911 IMMEDIATELY. If it is mild, please call our office.    Follow-Up:  3 MONTHS WITH DR Meda Coffee       If you need a refill on your cardiac medications before your next appointment, please call your pharmacy.

## 2018-02-12 ENCOUNTER — Ambulatory Visit (HOSPITAL_COMMUNITY): Admission: RE | Admit: 2018-02-12 | Payer: BC Managed Care – PPO | Source: Ambulatory Visit

## 2018-02-12 ENCOUNTER — Ambulatory Visit (HOSPITAL_COMMUNITY)
Admission: RE | Admit: 2018-02-12 | Discharge: 2018-02-12 | Disposition: A | Discharge status: Home / Self Care | Payer: BC Managed Care – PPO | Source: Ambulatory Visit | Attending: Cardiology | Admitting: Cardiology

## 2018-02-12 DIAGNOSIS — R072 Precordial pain: Secondary | ICD-10-CM

## 2018-02-12 DIAGNOSIS — Z8249 Family history of ischemic heart disease and other diseases of the circulatory system: Secondary | ICD-10-CM | POA: Diagnosis not present

## 2018-02-12 DIAGNOSIS — E785 Hyperlipidemia, unspecified: Secondary | ICD-10-CM

## 2018-02-12 DIAGNOSIS — R0789 Other chest pain: Secondary | ICD-10-CM | POA: Diagnosis not present

## 2018-02-12 MED ORDER — NITROGLYCERIN 0.4 MG SL SUBL
0.8000 mg | SUBLINGUAL_TABLET | Freq: Once | SUBLINGUAL | Status: AC
  Administered 2018-02-12: 0.8 mg via SUBLINGUAL
  Filled 2018-02-12: qty 25

## 2018-02-12 MED ORDER — IOPAMIDOL (ISOVUE-370) INJECTION 76%
INTRAVENOUS | Status: AC
  Filled 2018-02-12: qty 100

## 2018-02-12 MED ORDER — NITROGLYCERIN 0.4 MG SL SUBL
SUBLINGUAL_TABLET | SUBLINGUAL | Status: AC
  Administered 2018-02-12: 0.8 mg via SUBLINGUAL
  Filled 2018-02-12: qty 2

## 2018-02-12 MED ORDER — IOPAMIDOL (ISOVUE-370) INJECTION 76%
100.0000 mL | Freq: Once | INTRAVENOUS | Status: AC | PRN
  Administered 2018-02-12: 80 mL via INTRAVENOUS

## 2018-04-26 ENCOUNTER — Ambulatory Visit: Payer: BC Managed Care – PPO | Admitting: Cardiology

## 2018-04-28 ENCOUNTER — Ambulatory Visit: Payer: BC Managed Care – PPO | Admitting: Cardiology

## 2018-04-28 ENCOUNTER — Encounter: Payer: Self-pay | Admitting: Cardiology

## 2018-04-28 VITALS — BP 110/82 | HR 69 | Ht 63.0 in | Wt 170.0 lb

## 2018-04-28 DIAGNOSIS — E785 Hyperlipidemia, unspecified: Secondary | ICD-10-CM | POA: Diagnosis not present

## 2018-04-28 DIAGNOSIS — R002 Palpitations: Secondary | ICD-10-CM | POA: Diagnosis not present

## 2018-04-28 DIAGNOSIS — R072 Precordial pain: Secondary | ICD-10-CM

## 2018-04-28 MED ORDER — RED YEAST RICE 600 MG PO TABS: 600.0000 mg | ORAL_TABLET | Freq: Every day | ORAL | 2 refills | Status: DC

## 2018-04-28 NOTE — Patient Instructions (Signed)
Medication Instructions:   START TAKING RED YEAST RICE 600 MG ONCE DAILY     Testing/Procedures:  Your physician has recommended that you wear an event monitor. Event monitors are medical devices that record the heart's electrical activity. Doctors most often [place] these monitors to diagnose arrhythmias. Arrhythmias are problems with the speed or rhythm of the heartbeat. The monitor is a small, portable device. You can wear one while you do your normal daily activities. This is usually used to diagnose what is causing palpitations/syncope (passing out).       Follow-Up:  4 MONTHS WITH DR Meda Coffee       If you need a refill on your cardiac medications before your next appointment, please call your pharmacy.

## 2018-04-28 NOTE — Progress Notes (Signed)
Cardiology Office Note    Date:  04/28/2018   ID:  Rachel Woods, DOB 1966/02/15, MRN [id number]  PCP:  Esaw Grandchild, NP  Cardiologist:   Ena Dawley, MD   Chief complaint: chest pain  History of Present Illness:  Rachel Woods is a 52 y.o. female with prior medical history of untreated hyperlipidemia, significant family history of premature coronary artery disease was coming with concern of chest pain she has noticed that in the last year it happens on exertion but also at rest and it feels like a chest tightness feeling of impending doom and shortness of breath. This can last for about 5 minutes. She denies any dizziness. She does have palpitations that would last up to 30 seconds but only happen approximately once in 2 months. Her family history is significant for premature coronary artery disease, on her father's side 13 siblings all of them had coronary artery disease sometimes starting in their 45s and 51s. Her mother had stroke and hypertension, grandfather on mother's side had myocardial infarction at age of 71. She's never been treated for hyperlipidemia however she hasn't been to Dr.for the last 18 years.she underwent hysterectomy in January 2019, she she was having periods all the way to that.Hysterectomy performed for significant fibroids.  April 28, 2018 -the patient is coming for 3 months follow-up, she underwent coronary CTA in April 2019 that showed calcium score of 0 and no evidence of coronary artery disease she denies any further chest pain shortness of breath,, she is trying to improve her lifestyle with increased exercise and dietary modification.  Past Medical History:  Diagnosis Date  . ADHD   . Diverticulosis 2018  . Fibroids 2018  . GERD (gastroesophageal reflux disease)   . Headache    otc med prn  . Hyperlipidemia    diet controlled - no meds  . Reflux esophagitis 2018  . SVD (spontaneous vaginal delivery)    x 1   Past Surgical History:    Procedure Laterality Date  . COLONOSCOPY     polyps  . CYSTOSCOPY N/A 11/17/2017   Procedure: CYSTOSCOPY;  Surgeon: Megan Salon, MD;  Location: [facility];  Service: Gynecology;  Laterality: N/A;  . DENTAL SURGERY     root canal w/deep cleanings- general anesthesia  . SALPINGOOPHORECTOMY Right 11/17/2017   Procedure: RIGHT OOPHORECTOMY;  Surgeon: Megan Salon, MD;  Location: [facility];  Service: Gynecology;  Laterality: Right;  . TOTAL LAPAROSCOPIC HYSTERECTOMY WITH SALPINGECTOMY Bilateral 11/17/2017   Procedure: TOTAL LAPAROSCOPIC HYSTERECTOMY WITH SALPINGECTOMY WITH CAUTERIZATION OF ENDOMETRIOSIS;  Surgeon: Megan Salon, MD;  Location: [facility];  Service: Gynecology;  Laterality: Bilateral;  possible BSO/ large uterus/ need 5 hours OR time  . UPPER GI ENDOSCOPY    . WISDOM TOOTH EXTRACTION      Current Medications: Outpatient Medications Prior to Visit  Medication Sig Dispense Refill  . Aspirin-Acetaminophen-Caffeine (EXCEDRIN PO) Take 2 tablets by mouth 2 (two) times daily as needed (headaches).     . Cholecalciferol (VITAMIN D3) 5000 units CAPS Take 5,000 Units by mouth daily.     Marland Kitchen ibuprofen (ADVIL,MOTRIN) 800 MG tablet Take 1 tablet (800 mg total) by mouth every 8 (eight) hours as needed for headache or moderate pain. 30 tablet 0  . Ibuprofen-Diphenhydramine HCl (ADVIL PM) 200-25 MG CAPS Take 4 tablets by mouth at bedtime as needed (sleep).    Marland Kitchen omeprazole (PRILOSEC) 40 MG capsule Take 40 mg by mouth daily.    Marland Kitchen  metoprolol tartrate (LOPRESSOR) 50 MG tablet Take 1 tablet (50 mg total) by mouth once for 1 dose. Take 1 hour prior to your coronary CT. 1 tablet 0   No facility-administered medications prior to visit.      Allergies:   Patient has no known allergies.   Social History   Socioeconomic History  . Marital status: Divorced    Spouse name: Not on file  . Number of children: 1  . Years of education: Not on file  . Highest education level: Not on file  Occupational History   . Occupation: Teacher    Comment: [employer]  Social Needs  . Financial resource strain: Not on file  . Food insecurity:    Worry: Not on file    Inability: Not on file  . Transportation needs:    Medical: Not on file    Non-medical: Not on file  Tobacco Use  . Smoking status: Former Smoker    Years: 10.00    Types: Cigarettes  . Smokeless tobacco: Never Used  . Tobacco comment: occasional smoker - not every week  Substance and Sexual Activity  . Alcohol use: Yes    Comment: socially  . Drug use: No  . Sexual activity: Yes    Partners: Male    Birth control/protection: None  Lifestyle  . Physical activity:    Days per week: Not on file    Minutes per session: Not on file  . Stress: Not on file  Relationships  . Social connections:    Talks on phone: Not on file    Gets together: Not on file    Attends religious service: Not on file    Active member of club or organization: Not on file    Attends meetings of clubs or organizations: Not on file    Relationship status: Not on file  Other Topics Concern  . Not on file  Social History Narrative  . Not on file     Family History:  The patient's family history includes Dementia in her mother; Diabetes in her father; Fibroids in her mother; Glaucoma in her father; Heart disease in her father, maternal grandfather, mother, and paternal grandmother; Hyperlipidemia in her father and mother; Hypertension in her father and mother; Stroke in her mother; Thyroid disease in her mother.   ROS:   Please see the history of present illness.    ROS All other systems reviewed and are negative.   PHYSICAL EXAM:   VS:  BP 110/82   Pulse 69   Ht 5\' 3"  (1.6 m)   Wt 170 lb (77.1 kg)   LMP 08/19/2017   SpO2 99%   BMI 30.11 kg/m    GEN: Well nourished, well developed, in no acute distress  HEENT: normal  Neck: no JVD, carotid bruits, or masses Cardiac: RRR; no murmurs, rubs, or gallops,no edema  Respiratory:  clear to  auscultation bilaterally, normal work of breathing GI: soft, nontender, nondistended, + BS MS: no deformity or atrophy  Skin: warm and dry, no rash Neuro:  Alert and Oriented x 3, Strength and sensation are intact Psych: euthymic mood, full affect  Wt Readings from Last 3 Encounters:  04/28/18 170 lb (77.1 kg)  01/14/18 166 lb 9.6 oz (75.6 kg)  12/28/17 166 lb 9.6 oz (75.6 kg)      Studies/Labs Reviewed:   EKG:  EKG is not ordered today.    Recent Labs: 11/18/2017: Hemoglobin 10.6; Platelets 309 01/14/2018: ALT 23; BUN 11; Creatinine,  Ser 0.71; Potassium 4.4; Sodium 139; TSH 1.070   Lipid Panel    Component Value Date/Time   CHOL 191 01/14/2018 1152   TRIG 178 (H) 01/14/2018 1152   HDL 37 (L) 01/14/2018 1152   CHOLHDL 5.2 (H) 01/14/2018 1152   LDLCALC 118 (H) 01/14/2018 1152   Additional studies/ records that were reviewed today include:    ASSESSMENT:    1. Heart palpitations   2. Hyperlipidemia, unspecified hyperlipidemia type   3. Precordial pain    PLAN:  In order of problems listed above:  1. Precordial pain, most probably related to reflux, her calcium score is 0 and she has no evidence for coronary artery disease. 2. Hyperlipidemia -she has no evidence for coronary artery disease, her triglycerides and HDL are normal her LDL is borderline 118, she is advised to start using red yeast rice 600 mg daily, otherwise lifestyle modification with improved diet Mediterranean-style, and increase exercise level. 3. Palpitations -ongoing, we will obtain 30-day event monitor to further evaluate.  For now no medications.  Follow-up in 3 months.  Medication Adjustments/Labs and Tests Ordered: Current medicines are reviewed at length with the patient today.  Concerns regarding medicines are outlined above.  Medication changes, Labs and Tests ordered today are listed in the Patient Instructions below. Patient Instructions  Medication Instructions:   START TAKING RED YEAST  RICE 600 MG ONCE DAILY     Testing/Procedures:  Your physician has recommended that you wear an event monitor. Event monitors are medical devices that record the heart's electrical activity. Doctors most often [place] these monitors to diagnose arrhythmias. Arrhythmias are problems with the speed or rhythm of the heartbeat. The monitor is a small, portable device. You can wear one while you do your normal daily activities. This is usually used to diagnose what is causing palpitations/syncope (passing out).       Follow-Up:  4 MONTHS WITH DR Meda Coffee       If you need a refill on your cardiac medications before your next appointment, please call your pharmacy.      Signed, Ena Dawley, MD  04/28/2018 10:07 PM    [address], [place], [place]  [address] Phone: [phone]; Fax: [phone]

## 2018-04-30 ENCOUNTER — Ambulatory Visit (INDEPENDENT_AMBULATORY_CARE_PROVIDER_SITE_OTHER): Payer: BC Managed Care – PPO

## 2018-04-30 DIAGNOSIS — R002 Palpitations: Secondary | ICD-10-CM | POA: Diagnosis not present

## 2018-06-11 ENCOUNTER — Telehealth: Payer: Self-pay | Admitting: Cardiology

## 2018-06-11 NOTE — Telephone Encounter (Signed)
Called pt and left message for pt to call back to get event monitor results.

## 2018-06-15 ENCOUNTER — Encounter: Payer: Self-pay | Admitting: *Deleted

## 2018-07-25 IMAGING — CT CT HEART MORP W/ CTA COR W/ SCORE W/ CA W/CM &/OR W/O CM
4 of 7 series · 8 of 20 positions shown, 9 images · IV contrast (APPLIED)
Comparison: None.

CLINICAL DATA: 51 -year-old female with atypical chest pain and
family h/o premature CAD.

EXAM:
Cardiac/Coronary  CT
TECHNIQUE: The patient was scanned on a Phillips Force scanner.

[Series 7: best diast 73 % · axial · 0.32mm/px · z∈[+1263,+1306]mm · 2 of 326 slices shown, 3 images]
[im 109/326  vessel]
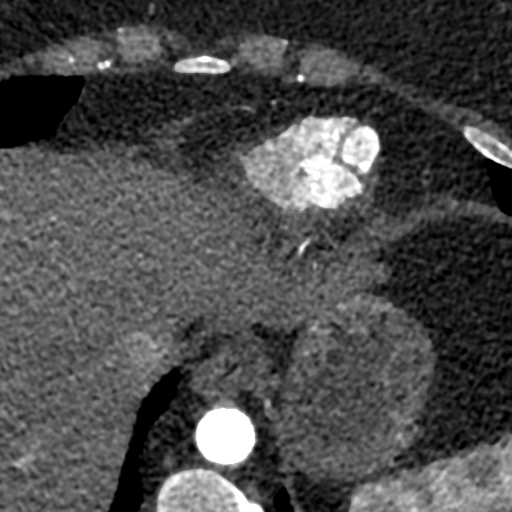
[im 109/326  lung]
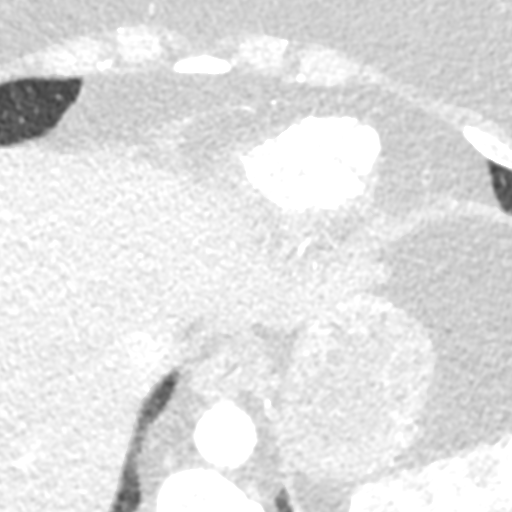
[im 217/326  vessel]
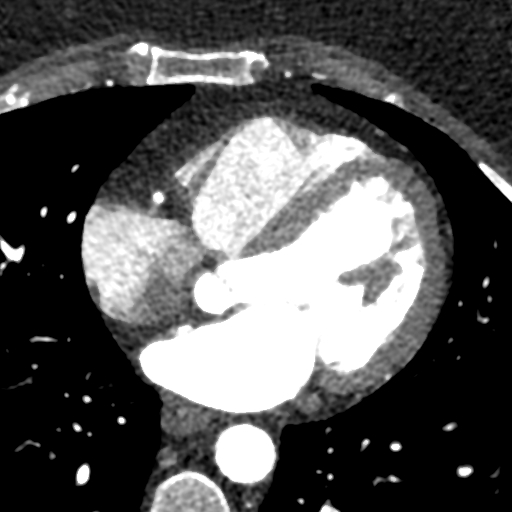

[Series 8: best syst 38 % · axial · 0.32mm/px · z∈[+1263,+1306]mm · 2 of 326 slices shown]
[im 109/326  vessel]
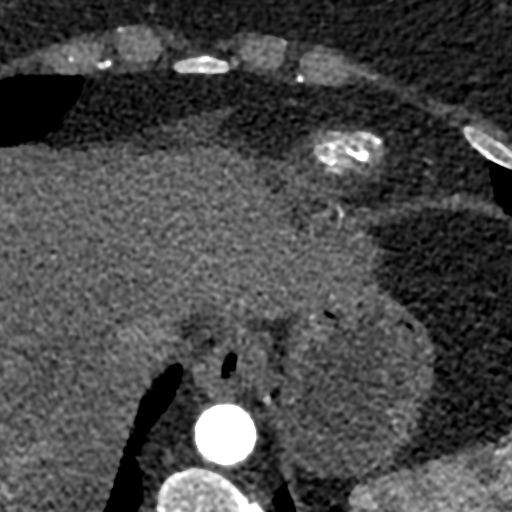
[im 217/326  vessel]
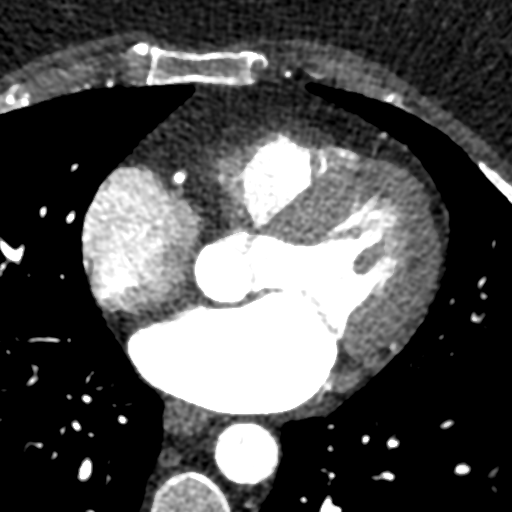

[Series 9: ts diast sharp 73 % · axial · 0.32mm/px · z∈[+1263,+1306]mm · 2 of 326 slices shown]
[im 109/326  lung]
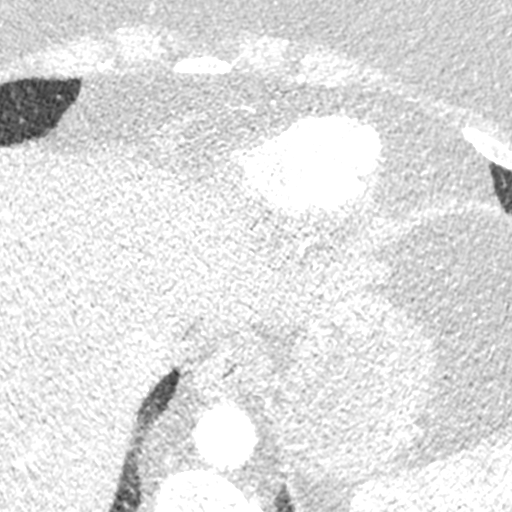
[im 217/326  lung]
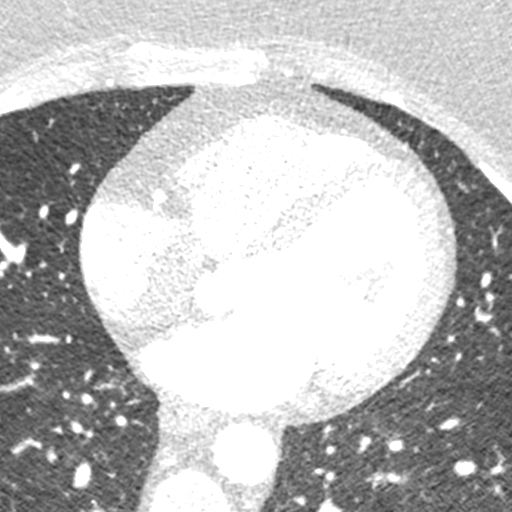

[Series 10: ts syst sharp 38 % · axial · 0.32mm/px · z∈[+1263,+1306]mm · 2 of 326 slices shown]
[im 109/326  lung]
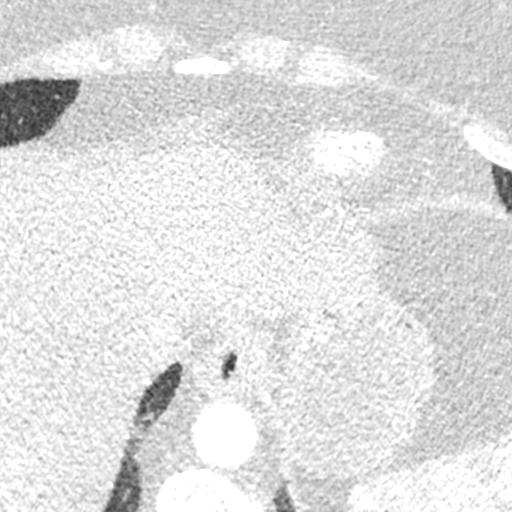
[im 217/326  lung]
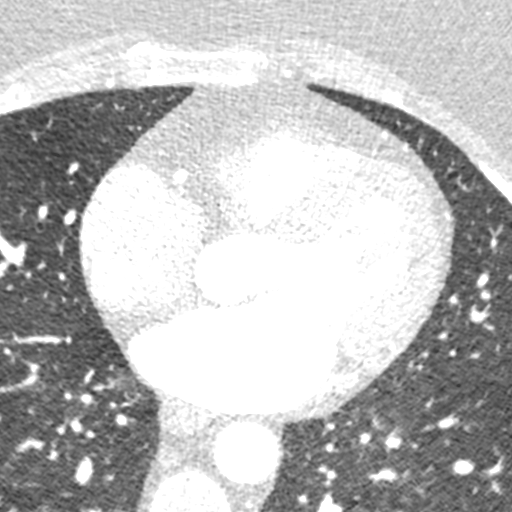

[8 of 20 positions shown; findings below may reference images not displayed]



Aorta:  Normal size.  No calcifications.  No dissection.

Aortic Valve:  Trileaflet.  No calcifications.

Coronary Arteries:  Normal coronary origin.  Right dominance.

RCA is a very large dominant artery that gives rise to PDA and PLVB.
There is no plaque.

Left main is a large artery that gives rise to LAD and LCX arteries.

LAD is a large vessel that has no plaque.

LCX is a non-dominant artery that gives rise to one large OM1
branch. There is no plaque.

Other findings:

Normal pulmonary vein drainage into the left atrium.

Normal let atrial appendage without a thrombus.

Normal size of the pulmonary artery.
IMPRESSION: 1. Coronary calcium score of 0. This was 0 percentile for age and
sex matched control.

2. Normal coronary origin with right dominance.

3. No evidence of CAD.

EXAM:
OVER-READ INTERPRETATION  CT CHEST

The following report is an over-read performed by radiologist Dr.
Yoel Tiger [REDACTED] on 02/12/2018. This over-read
does not include interpretation of cardiac or coronary anatomy or
pathology. The coronary CTA interpretation by the cardiologist is
attached.
FINDINGS: Vascular: Heart is normal size.  Visualized aorta is normal caliber.

Mediastinum/Nodes: No adenopathy in the lower mediastinum or hila.

Lungs/Pleura: Minimal dependent atelectasis.  No effusions.

Upper Abdomen: Imaging into the upper abdomen shows no acute
findings.

Musculoskeletal: Chest wall soft tissues are unremarkable. No acute
bony abnormality.
IMPRESSION: No acute or significant extracardiac abnormality.

## 2018-09-16 ENCOUNTER — Encounter: Payer: Self-pay | Admitting: Cardiology

## 2018-09-16 ENCOUNTER — Ambulatory Visit (INDEPENDENT_AMBULATORY_CARE_PROVIDER_SITE_OTHER): Payer: BC Managed Care – PPO | Admitting: Cardiology

## 2018-09-16 VITALS — BP 124/64 | HR 76 | Ht 63.0 in | Wt 159.0 lb

## 2018-09-16 DIAGNOSIS — E785 Hyperlipidemia, unspecified: Secondary | ICD-10-CM | POA: Diagnosis not present

## 2018-09-16 DIAGNOSIS — R002 Palpitations: Secondary | ICD-10-CM | POA: Diagnosis not present

## 2018-09-16 DIAGNOSIS — Z8249 Family history of ischemic heart disease and other diseases of the circulatory system: Secondary | ICD-10-CM | POA: Diagnosis not present

## 2018-09-16 NOTE — Progress Notes (Signed)
Cardiology Office Note    Date:  09/16/2018   ID:  Rachel Woods, DOB 01-20-1966, MRN [id number]  PCP:  Esaw Grandchild, NP  Cardiologist:   Ena Dawley, MD   Chief complaint: chest pain  History of Present Illness:  Rachel Woods is a 52 y.o. female with prior medical history of untreated hyperlipidemia, significant family history of premature coronary artery disease was coming with concern of chest pain she has noticed that in the last year it happens on exertion but also at rest and it feels like a chest tightness feeling of impending doom and shortness of breath. This can last for about 5 minutes. She denies any dizziness. She does have palpitations that would last up to 30 seconds but only happen approximately once in 2 months. Her family history is significant for premature coronary artery disease, on her father's side 13 siblings all of them had coronary artery disease sometimes starting in their 41s and 58s. Her mother had stroke and hypertension, grandfather on mother's side had myocardial infarction at age of 27. She's never been treated for hyperlipidemia however she hasn't been to Dr.for the last 18 years.she underwent hysterectomy in January 2019, she she was having periods all the way to that.Hysterectomy performed for significant fibroids.  April 28, 2018 -the patient is coming for 3 months follow-up, she underwent coronary CTA in April 2019 that showed calcium score of 0 and no evidence of coronary artery disease she denies any further chest pain shortness of breath,, she is trying to improve her lifestyle with increased exercise and dietary modification.  09/16/2018, this is 3 months follow-up, the patient has been doing great, she gets occasional palpitations for about 30 seconds approximately every other months, otherwise has no chest pain, she started to walk daily in the morning for about a mile and a half and has no symptoms with that.  Past Medical History:    Diagnosis Date  . ADHD   . Diverticulosis 2018  . Fibroids 2018  . GERD (gastroesophageal reflux disease)   . Headache    otc med prn  . Hyperlipidemia    diet controlled - no meds  . Reflux esophagitis 2018  . SVD (spontaneous vaginal delivery)    x 1   Past Surgical History:  Procedure Laterality Date  . COLONOSCOPY     polyps  . CYSTOSCOPY N/A 11/17/2017   Procedure: CYSTOSCOPY;  Surgeon: Megan Salon, MD;  Location: [facility];  Service: Gynecology;  Laterality: N/A;  . DENTAL SURGERY     root canal w/deep cleanings- general anesthesia  . SALPINGOOPHORECTOMY Right 11/17/2017   Procedure: RIGHT OOPHORECTOMY;  Surgeon: Megan Salon, MD;  Location: [facility];  Service: Gynecology;  Laterality: Right;  . TOTAL LAPAROSCOPIC HYSTERECTOMY WITH SALPINGECTOMY Bilateral 11/17/2017   Procedure: TOTAL LAPAROSCOPIC HYSTERECTOMY WITH SALPINGECTOMY WITH CAUTERIZATION OF ENDOMETRIOSIS;  Surgeon: Megan Salon, MD;  Location: [facility];  Service: Gynecology;  Laterality: Bilateral;  possible BSO/ large uterus/ need 5 hours OR time  . UPPER GI ENDOSCOPY    . WISDOM TOOTH EXTRACTION      Current Medications: Outpatient Medications Prior to Visit  Medication Sig Dispense Refill  . Aspirin-Acetaminophen-Caffeine (EXCEDRIN PO) Take 2 tablets by mouth 2 (two) times daily as needed (headaches).     . Cholecalciferol (VITAMIN D3) 5000 units CAPS Take 5,000 Units by mouth daily.     Marland Kitchen ibuprofen (ADVIL,MOTRIN) 800 MG tablet Take 1 tablet (800 mg total) by  mouth every 8 (eight) hours as needed for headache or moderate pain. 30 tablet 0  . Ibuprofen-Diphenhydramine HCl (ADVIL PM) 200-25 MG CAPS Take 4 tablets by mouth at bedtime as needed (sleep).    . metoprolol tartrate (LOPRESSOR) 50 MG tablet Take 1 tablet (50 mg total) by mouth once for 1 dose. Take 1 hour prior to your coronary CT. 1 tablet 0  . omeprazole (PRILOSEC) 40 MG capsule Take 40 mg by mouth daily.    . Red Yeast Rice 600 MG TABS Take 1 tablet  (600 mg total) by mouth daily. 90 tablet 2   No facility-administered medications prior to visit.      Allergies:   Patient has no known allergies.   Social History   Socioeconomic History  . Marital status: Divorced    Spouse name: Not on file  . Number of children: 1  . Years of education: Not on file  . Highest education level: Not on file  Occupational History  . Occupation: Teacher    Comment: [employer]  Social Needs  . Financial resource strain: Not on file  . Food insecurity:    Worry: Not on file    Inability: Not on file  . Transportation needs:    Medical: Not on file    Non-medical: Not on file  Tobacco Use  . Smoking status: Former Smoker    Years: 10.00    Types: Cigarettes  . Smokeless tobacco: Never Used  . Tobacco comment: occasional smoker - not every week  Substance and Sexual Activity  . Alcohol use: Yes    Comment: socially  . Drug use: No  . Sexual activity: Yes    Partners: Male    Birth control/protection: None  Lifestyle  . Physical activity:    Days per week: Not on file    Minutes per session: Not on file  . Stress: Not on file  Relationships  . Social connections:    Talks on phone: Not on file    Gets together: Not on file    Attends religious service: Not on file    Active member of club or organization: Not on file    Attends meetings of clubs or organizations: Not on file    Relationship status: Not on file  Other Topics Concern  . Not on file  Social History Narrative  . Not on file    Family History:  The patient's family history includes Dementia in her mother; Diabetes in her father; Fibroids in her mother; Glaucoma in her father; Heart disease in her father, maternal grandfather, mother, and paternal grandmother; Hyperlipidemia in her father and mother; Hypertension in her father and mother; Stroke in her mother; Thyroid disease in her mother.   ROS:   Please see the history of present illness.    ROS All other  systems reviewed and are negative.  PHYSICAL EXAM:   VS:  BP 124/64   Pulse 76   Ht 5\' 3"  (1.6 m)   Wt 159 lb (72.1 kg)   LMP 08/19/2017   BMI 28.17 kg/m    GEN: Well nourished, well developed, in no acute distress  HEENT: normal  Neck: no JVD, carotid bruits, or masses Cardiac: RRR; no murmurs, rubs, or gallops,no edema  Respiratory:  clear to auscultation bilaterally, normal work of breathing GI: soft, nontender, nondistended, + BS MS: no deformity or atrophy  Skin: warm and dry, no rash Neuro:  Alert and Oriented x 3, Strength and sensation are  intact Psych: euthymic mood, full affect  Wt Readings from Last 3 Encounters:  09/16/18 159 lb (72.1 kg)  04/28/18 170 lb (77.1 kg)  01/14/18 166 lb 9.6 oz (75.6 kg)    Studies/Labs Reviewed:   EKG:  EKG is not ordered today.    Recent Labs: 11/18/2017: Hemoglobin 10.6; Platelets 309 01/14/2018: ALT 23; BUN 11; Creatinine, Ser 0.71; Potassium 4.4; Sodium 139; TSH 1.070   Lipid Panel    Component Value Date/Time   CHOL 191 01/14/2018 1152   TRIG 178 (H) 01/14/2018 1152   HDL 37 (L) 01/14/2018 1152   CHOLHDL 5.2 (H) 01/14/2018 1152   LDLCALC 118 (H) 01/14/2018 1152   Additional studies/ records that were reviewed today include:    ASSESSMENT:    1. Hyperlipidemia, unspecified hyperlipidemia type   2. Heart palpitations   3. Family history of early CAD    PLAN:  In order of problems listed above:  1. Precordial pain, most probably related to reflux, has completely resolved on Protonix.  Her calcium score is 0 and she has no evidence for coronary artery disease. 2. Hyperlipidemia -she has no evidence for coronary artery disease, her triglycerides and HDL are normal her LDL is borderline 118, she is advised to start using red yeast rice 600 mg daily, otherwise lifestyle modification, today I am advising to add fish oil. Palpitations - 30-day event monitor showed no arrhythmias whatsoever.  This might be related to  anxiety.  Follow-up in 6 months with labs.  Medication Adjustments/Labs and Tests Ordered: Current medicines are reviewed at length with the patient today.  Concerns regarding medicines are outlined above.  Medication changes, Labs and Tests ordered today are listed in the Patient Instructions below. Patient Instructions  Medication Instructions:   Your physician recommends that you continue on your current medications as directed. Please refer to the Current Medication list given to you today.  If you need a refill on your cardiac medications before your next appointment, please call your pharmacy.     Lab work:  On 03/18/2019 for labs--WE WILL CHECK CMET, TSH, AND LIPIDS--PLEASE COME FASTING TO THIS LAB APPOINTMENT  If you have labs (blood work) drawn today and your tests are completely normal, you will receive your results only by: Marland Kitchen MyChart Message (if you have MyChart) OR . A paper copy in the mail If you have any lab test that is abnormal or we need to change your treatment, we will call you to review the results.    Follow-Up: At [facility], you and your health needs are our priority.  As part of our continuing mission to provide you with exceptional heart care, we have created designated Provider Care Teams.  These Care Teams include your primary Cardiologist (physician) and Advanced Practice Providers (APPs -  Physician Assistants and Nurse Practitioners) who all work together to provide you with the care you need, when you need it. You will need a follow up appointment in 6 months.  Please call our office 2 months in advance to schedule this appointment.  You may see Ena Dawley, MD or one of the following Advanced Practice Providers on your designated Care Team:   [place], PA-C Melina Copa, PA-C . Ermalinda Barrios, PA-C     Signed, Ena Dawley, MD  09/16/2018 5:04 PM    [facility] [address], [place], [place]   [address] Phone: [phone]; Fax: [phone]

## 2018-09-16 NOTE — Patient Instructions (Signed)
Medication Instructions:   Your physician recommends that you continue on your current medications as directed. Please refer to the Current Medication list given to you today.  If you need a refill on your cardiac medications before your next appointment, please call your pharmacy.     Lab work:  On 03/18/2019 for labs--WE WILL CHECK CMET, TSH, AND LIPIDS--PLEASE COME FASTING TO THIS LAB APPOINTMENT  If you have labs (blood work) drawn today and your tests are completely normal, you will receive your results only by: Marland Kitchen MyChart Message (if you have MyChart) OR . A paper copy in the mail If you have any lab test that is abnormal or we need to change your treatment, we will call you to review the results.    Follow-Up: At [facility], you and your health needs are our priority.  As part of our continuing mission to provide you with exceptional heart care, we have created designated Provider Care Teams.  These Care Teams include your primary Cardiologist (physician) and Advanced Practice Providers (APPs -  Physician Assistants and Nurse Practitioners) who all work together to provide you with the care you need, when you need it. You will need a follow up appointment in 6 months.  Please call our office 2 months in advance to schedule this appointment.  You may see Ena Dawley, MD or one of the following Advanced Practice Providers on your designated Care Team:   [place], PA-C Melina Copa, PA-C . Ermalinda Barrios, PA-C

## 2018-12-30 ENCOUNTER — Other Ambulatory Visit: Payer: Self-pay | Admitting: Physical Medicine and Rehabilitation

## 2018-12-30 ENCOUNTER — Other Ambulatory Visit: Payer: Self-pay

## 2018-12-30 ENCOUNTER — Other Ambulatory Visit: Payer: Self-pay | Admitting: Obstetrics & Gynecology

## 2018-12-30 ENCOUNTER — Encounter: Payer: Self-pay | Admitting: Obstetrics & Gynecology

## 2018-12-30 ENCOUNTER — Ambulatory Visit: Payer: BC Managed Care – PPO | Admitting: Obstetrics & Gynecology

## 2018-12-30 VITALS — BP 126/80 | HR 76 | Resp 16 | Ht 63.0 in | Wt 162.0 lb

## 2018-12-30 DIAGNOSIS — Z1231 Encounter for screening mammogram for malignant neoplasm of breast: Secondary | ICD-10-CM

## 2018-12-30 DIAGNOSIS — N393 Stress incontinence (female) (male): Secondary | ICD-10-CM

## 2018-12-30 DIAGNOSIS — Z01419 Encounter for gynecological examination (general) (routine) without abnormal findings: Secondary | ICD-10-CM

## 2018-12-30 DIAGNOSIS — E041 Nontoxic single thyroid nodule: Secondary | ICD-10-CM | POA: Diagnosis not present

## 2018-12-30 MED ORDER — DULOXETINE HCL 20 MG PO CPEP: 20.0000 mg | ORAL_CAPSULE | Freq: Every day | ORAL | 0 refills | Status: DC

## 2018-12-30 NOTE — Progress Notes (Signed)
53 y.o. G1P1 Divorced White or Caucasian female here for annual exam.  Father was diagnosed with pancreatic cancer within the last two weeks.  He is 79.  Feels the diagnosis was made early.  On chemo for 3 months and then will have the Whipple procedure.   Feeling a little overwhelmed right now with father and her son.  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    Is being followed by Dr. Meda Coffee, cardiology.  Wore a Holter monitor for a month.  Coronary calcium score was 0.  LDL were 188.  On red yeast rice 600mg  q day.  Has follow up every six months, for now.      Denies vaginal bleeding.  Feels bladder is weak.  Leaks some with laughing or coughing.  Coaching with running club.   Not having hot flashes.     Patient's last menstrual period was 08/19/2017.          Sexually active: Yes.    The current method of family planning is post hysterectomy.    Exercising: No.   Smoker:  no  Health Maintenance: Pap:  06/18/17 Neg. HR HPV:neg  History of abnormal Pap:  no MMG:  08/28/17 Right Bx: Fibroadenoma  Colonoscopy:  07/15/17 f/u 10 years BMD:   Never TDaP:  2016 Screening Labs: PCP   reports that she has quit smoking. Her smoking use included cigarettes. She quit after 10.00 years of use. She has never used smokeless tobacco. She reports current alcohol use. She reports that she does not use drugs.  Past Medical History:  Diagnosis Date  . ADHD   . Diverticulosis 2018  . Fibroids 2018  . GERD (gastroesophageal reflux disease)   . Headache    otc med prn  . Hyperlipidemia    diet controlled - no meds  . Reflux esophagitis 2018  . SVD (spontaneous vaginal delivery)    x 1    Past Surgical History:  Procedure Laterality Date  . COLONOSCOPY     polyps  . CYSTOSCOPY N/A 11/17/2017   Procedure: CYSTOSCOPY;  Surgeon: Megan Salon, MD;  Location: [facility];  Service: Gynecology;  Laterality: N/A;  . DENTAL SURGERY     root canal w/deep cleanings- general  anesthesia  . SALPINGOOPHORECTOMY Right 11/17/2017   Procedure: RIGHT OOPHORECTOMY;  Surgeon: Megan Salon, MD;  Location: [facility];  Service: Gynecology;  Laterality: Right;  . TOTAL LAPAROSCOPIC HYSTERECTOMY WITH SALPINGECTOMY Bilateral 11/17/2017   Procedure: TOTAL LAPAROSCOPIC HYSTERECTOMY WITH SALPINGECTOMY WITH CAUTERIZATION OF ENDOMETRIOSIS;  Surgeon: Megan Salon, MD;  Location: [facility];  Service: Gynecology;  Laterality: Bilateral;  possible BSO/ large uterus/ need 5 hours OR time  . UPPER GI ENDOSCOPY    . WISDOM TOOTH EXTRACTION      Current Outpatient Medications  Medication Sig Dispense Refill  . Aspirin-Acetaminophen-Caffeine (EXCEDRIN PO) Take 2 tablets by mouth 2 (two) times daily as needed (headaches).     . Cholecalciferol (VITAMIN D3) 5000 units CAPS Take 5,000 Units by mouth daily.     Marland Kitchen ibuprofen (ADVIL,MOTRIN) 800 MG tablet Take 1 tablet (800 mg total) by mouth every 8 (eight) hours as needed for headache or moderate pain. 30 tablet 0  . Ibuprofen-Diphenhydramine HCl (ADVIL PM) 200-25 MG CAPS Take 4 tablets by mouth at bedtime as needed (sleep).    Marland Kitchen omeprazole (PRILOSEC) 40 MG capsule Take 40 mg by mouth daily.    . Red Yeast Rice 600 MG TABS Take  1 tablet (600 mg total) by mouth daily. 90 tablet 2   No current facility-administered medications for this visit.     Family History  Problem Relation Age of Onset  . Hypertension Mother   . Dementia Mother   . Fibroids Mother   . Heart disease Mother   . Hyperlipidemia Mother   . Stroke Mother        TIAs  . Thyroid disease Mother   . Glaucoma Father   . Diabetes Father   . Hypertension Father   . Heart disease Father   . Hyperlipidemia Father   . Heart disease Maternal Grandfather   . Heart disease Paternal Grandmother     Review of Systems  Genitourinary:       Loss of sexual interest. Pain or bleeding with intercourse Loss of urine with sneeze or cough    Skin:       Itching    Psychiatric/Behavioral: The patient is nervous/anxious.        Depression   All other systems reviewed and are negative.   Exam:   BP 126/80 (BP Location: Right Arm, Patient Position: Sitting, Cuff Size: Normal)   Pulse 76   Resp 16   Ht 5\' 3"  (1.6 m)   Wt 162 lb (73.5 kg)   LMP 08/19/2017   BMI 28.70 kg/m   Height: 5\' 3"  (160 cm)  Ht Readings from Last 3 Encounters:  12/30/18 5\' 3"  (1.6 m)  09/16/18 5\' 3"  (1.6 m)  04/28/18 5\' 3"  (1.6 m)    General appearance: alert, cooperative and appears stated age Head: Normocephalic, without obvious abnormality, atraumatic Neck: no adenopathy, supple, symmetrical, trachea midline.  3cm right thyroid nodule noted Lungs: clear to auscultation bilaterally Breasts: normal appearance, no masses or tenderness Heart: regular rate and rhythm Abdomen: soft, non-tender; bowel sounds normal; no masses,  no organomegaly Extremities: extremities normal, atraumatic, no cyanosis or edema Skin: Skin color, texture, turgor normal. No rashes or lesions Lymph nodes: Cervical, supraclavicular, and axillary nodes normal. No abnormal inguinal nodes palpated Neurologic: Grossly normal   Pelvic: External genitalia:  no lesions              Urethra:  normal appearing urethra with no masses, tenderness or lesions              Bartholins and Skenes: normal                 Vagina: normal appearing vagina with normal color and discharge, no lesions              Cervix: absent              Pap taken: No. Bimanual Exam:  Uterus:  uterus absent              Adnexa: no mass, fullness, tenderness               Rectovaginal: Confirms               Anus:  normal sphincter tone, no lesions  Chaperone was present for exam.  A:  Well Woman with normal exam S/p TLH/bilateral salpingectomy/right oophorectomy, cystoscopy Right thyroid nodule SUI Strong family hx of CVD, followed by cardiology  P:   Mammogram guidelines reviewed.  Pt aware this is due pap smear not  indicated TSH obtained today Thyroid ultrasound will be scheduled for pt. Return annually or prn

## 2018-12-30 NOTE — Progress Notes (Signed)
Patient is scheduled for Thyroid Ultrasound tomorrow, 12/31/2018 at 1:30 at [facility] at [address], [place] [address].   Also, patient is scheduled for screening 3D mammogram 01/31/2019 at 1400.

## 2018-12-31 ENCOUNTER — Ambulatory Visit
Admission: RE | Admit: 2018-12-31 | Discharge: 2018-12-31 | Disposition: A | Discharge status: Home / Self Care | Payer: BC Managed Care – PPO | Source: Ambulatory Visit | Attending: Obstetrics & Gynecology | Admitting: Obstetrics & Gynecology

## 2018-12-31 DIAGNOSIS — E041 Nontoxic single thyroid nodule: Secondary | ICD-10-CM

## 2018-12-31 LAB — TSH: TSH: 0.867 u[IU]/mL (ref 0.450–4.500)

## 2019-01-05 ENCOUNTER — Telehealth: Payer: Self-pay | Admitting: *Deleted

## 2019-01-05 NOTE — Telephone Encounter (Signed)
Notes recorded by Burnice Logan, RN on 01/05/2019 at 3:01 PM EDT Left message to call Sharee Pimple, RN at [facility].

## 2019-01-05 NOTE — Telephone Encounter (Signed)
Spoke with patient, advised as seen below per Dr. Miller. Patient verbalizes understanding and is agreeable.   Encounter closed.  

## 2019-01-05 NOTE — Telephone Encounter (Signed)
-----   Message from Megan Salon, MD sent at 01/05/2019  2:37 PM EDT ----- Please let pt know she does have four nodules in her thyroid, three are small and one is 3.7cm and this is what I felt in the office.  These are all benign and do not need follow up unless increase in size.    Her TSH obtained in the office was normal as well.  Out of imaging hold.  Thanks.

## 2019-01-27 ENCOUNTER — Other Ambulatory Visit: Payer: Self-pay | Admitting: Obstetrics & Gynecology

## 2019-01-27 NOTE — Telephone Encounter (Signed)
Left message to call Laylee Schooley, RN at [facility] [phone].   

## 2019-01-27 NOTE — Telephone Encounter (Signed)
Message sent through requesting increasing medication dosage.   Please facilitate a WebEx visit.  If this is not possible, I would like to do a telephone visit with the patient.

## 2019-01-28 ENCOUNTER — Ambulatory Visit (INDEPENDENT_AMBULATORY_CARE_PROVIDER_SITE_OTHER): Payer: BC Managed Care – PPO | Admitting: Obstetrics and Gynecology

## 2019-01-28 ENCOUNTER — Other Ambulatory Visit: Payer: Self-pay

## 2019-01-28 ENCOUNTER — Encounter: Payer: Self-pay | Admitting: Obstetrics and Gynecology

## 2019-01-28 DIAGNOSIS — F419 Anxiety disorder, unspecified: Secondary | ICD-10-CM

## 2019-01-28 DIAGNOSIS — F3289 Other specified depressive episodes: Secondary | ICD-10-CM

## 2019-01-28 MED ORDER — DULOXETINE HCL 20 MG PO CPEP: ORAL_CAPSULE | ORAL | 1 refills | Status: DC

## 2019-01-28 MED ORDER — ALPRAZOLAM 0.5 MG PO TABS
0.5000 mg | ORAL_TABLET | Freq: Three times a day (TID) | ORAL | 0 refills | Status: DC | PRN
Start: 2019-01-28 — End: 2023-05-22

## 2019-01-28 NOTE — Progress Notes (Signed)
Virtual Visit via Telephone Note  I connected with Rachel Woods on 01/28/19 at 10:00 AM EDT by telephone and verified that I am speaking with the correct person using two identifiers.   This was a Engineer, maintenance ex visit.  I discussed the limitations, risks, security and privacy concerns of performing an evaluation and management service by telephone and the availability of in person appointments. I also discussed with the patient that there may be a patient responsible charge related to this service. The patient expressed understanding and agreed to proceed.  GYNECOLOGY  VISIT   HPI: 53 y.o.   Divorced White or Caucasian Not Hispanic or Latino  female   G1P1 with Patient's last menstrual period was 08/19/2017.   Presented to Web-ex meeting to discuss depression and anxiety. She was started on Cymbalta by Dr Sabra Heck last month for anxiety and depression. She feels the medication has helped, but she is still having mild depression and her anxiety is still significant. She is having several small panic attacks daily, is waking up at night. Her father has pancreatic cancer, he was recently hospitalized with sepsis and with the covid 19 crisis she couldn't see him in the hospital. 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  She states that she has a good support system and currently declines seeing a therapist.  She has not had any side effects with the medication.   GYNECOLOGIC HISTORY: Patient's last menstrual period was 08/19/2017. Contraception:hysterectomy Menopausal hormone therapy: no        OB History    Gravida  1   Para  1   Term      Preterm      AB      Living  1     SAB      TAB      Ectopic      Multiple      Live Births                 Patient Active Problem List   Diagnosis Date Noted  . Umbilical bleeding 70/17/7939  . Health care maintenance 03/11/2017  . Poor  concentration 03/11/2017    Past Medical History:  Diagnosis Date  . ADHD   . Diverticulosis 2018  . Fibroids 2018  . GERD (gastroesophageal reflux disease)   . Headache    otc med prn  . Hyperlipidemia    diet controlled - no meds  . Reflux esophagitis 2018  . SVD (spontaneous vaginal delivery)    x 1    Past Surgical History:  Procedure Laterality Date  . COLONOSCOPY     polyps  . CYSTOSCOPY N/A 11/17/2017   Procedure: CYSTOSCOPY;  Surgeon: Megan Salon, MD;  Location: [facility];  Service: Gynecology;  Laterality: N/A;  . DENTAL SURGERY     root canal w/deep cleanings- general anesthesia  . SALPINGOOPHORECTOMY Right 11/17/2017   Procedure: RIGHT OOPHORECTOMY;  Surgeon: Megan Salon, MD;  Location: [facility];  Service: Gynecology;  Laterality: Right;  . TOTAL LAPAROSCOPIC HYSTERECTOMY WITH SALPINGECTOMY Bilateral 11/17/2017   Procedure: TOTAL LAPAROSCOPIC HYSTERECTOMY WITH SALPINGECTOMY WITH CAUTERIZATION OF ENDOMETRIOSIS;  Surgeon: Megan Salon, MD;  Location: [facility];  Service: Gynecology;  Laterality: Bilateral;  possible BSO/ large uterus/ need 5 hours OR time  . UPPER GI ENDOSCOPY    . WISDOM TOOTH EXTRACTION      Current  Outpatient Medications  Medication Sig Dispense Refill  . Aspirin-Acetaminophen-Caffeine (EXCEDRIN PO) Take 2 tablets by mouth 2 (two) times daily as needed (headaches).     . Cholecalciferol (VITAMIN D3) 5000 units CAPS Take 5,000 Units by mouth daily.     . DULoxetine (CYMBALTA) 20 MG capsule Take 1 capsule (20 mg total) by mouth daily. 30 capsule 0  . ibuprofen (ADVIL,MOTRIN) 800 MG tablet Take 1 tablet (800 mg total) by mouth every 8 (eight) hours as needed for headache or moderate pain. 30 tablet 0  . Ibuprofen-Diphenhydramine HCl (ADVIL PM) 200-25 MG CAPS Take 4 tablets by mouth at bedtime as needed (sleep).    Marland Kitchen omeprazole (PRILOSEC) 40 MG capsule Take 40 mg by mouth daily.    . Red Yeast Rice 600 MG TABS Take 1 tablet (600 mg total) by mouth  daily. 90 tablet 2   No current facility-administered medications for this visit.      ALLERGIES: Patient has no known allergies.  Family History  Problem Relation Age of Onset  . Hypertension Mother   . Dementia Mother   . Fibroids Mother   . Heart disease Mother   . Hyperlipidemia Mother   . Stroke Mother        TIAs  . Thyroid disease Mother   . Glaucoma Father   . Diabetes Father   . Hypertension Father   . Heart disease Father   . Hyperlipidemia Father   . Heart disease Maternal Grandfather   . Heart disease Paternal Grandmother     Social History   Socioeconomic History  . Marital status: Divorced    Spouse name: Not on file  . Number of children: 1  . Years of education: Not on file  . Highest education level: Not on file  Occupational History  . Occupation: Teacher    Comment: [employer]  Social Needs  . Financial resource strain: Not on file  . Food insecurity:    Worry: Not on file    Inability: Not on file  . Transportation needs:    Medical: Not on file    Non-medical: Not on file  Tobacco Use  . Smoking status: Former Smoker    Years: 10.00    Types: Cigarettes  . Smokeless tobacco: Never Used  Substance and Sexual Activity  . Alcohol use: Yes    Alcohol/week: 0.0 - 1.0 standard drinks  . Drug use: No  . Sexual activity: Yes    Partners: Male    Birth control/protection: None  Lifestyle  . Physical activity:    Days per week: Not on file    Minutes per session: Not on file  . Stress: Not on file  Relationships  . Social connections:    Talks on phone: Not on file    Gets together: Not on file    Attends religious service: Not on file    Active member of club or organization: Not on file    Attends meetings of clubs or organizations: Not on file    Relationship status: Not on file  . Intimate partner violence:    Fear of current or ex partner: Not on file    Emotionally abused: Not on file    Physically abused: Not on file     Forced sexual activity: Not on file  Other Topics Concern  . Not on file  Social History Narrative  . Not on file    PHYSICAL EXAMINATION:    LMP 08/19/2017  General appearance: alert, cooperative and appears stated age Normal affect.   ASSESSMENT Depression helped some with Cymbalta Anxiety is still significant, many situational factors    PLAN Will increase her Cymbalta to 40 mg a day, f/u in one month with Dr Sabra Heck Will give a limited amount of Xanax to help with her panic attacks until the higher dose of Cymbalta can help   An After Visit Summary was printed and given to the patient.     Salvadore Dom, MD  CC: Dr Sabra Heck

## 2019-01-31 ENCOUNTER — Ambulatory Visit: Payer: BC Managed Care – PPO

## 2019-01-31 NOTE — Telephone Encounter (Signed)
Left message to call Kili Gracy, RN at [facility] [phone].   MyChart message to patient.     

## 2019-01-31 NOTE — Telephone Encounter (Signed)
Per review of Epic, patient seen by WebEx visit on 01/28/19 by Dr. Talbert Nan.   MyChart message to patient.   Routing to provider for final review. Patient is agreeable to disposition. Will close encounter.

## 2019-03-14 ENCOUNTER — Ambulatory Visit: Payer: BC Managed Care – PPO

## 2019-03-18 ENCOUNTER — Other Ambulatory Visit: Payer: BC Managed Care – PPO

## 2019-12-24 ENCOUNTER — Ambulatory Visit: Payer: BC Managed Care – PPO | Attending: Internal Medicine

## 2019-12-24 DIAGNOSIS — Z23 Encounter for immunization: Secondary | ICD-10-CM | POA: Insufficient documentation

## 2019-12-24 NOTE — Progress Notes (Signed)
   Covid-19 Vaccination Clinic  Name:  Rachel Woods    MRN: [id number] DOB: 03/03/1966  12/24/2019  [name] was observed post Covid-19 immunization for 15 minutes without incidence. She was provided with Vaccine Information Sheet and instruction to access the V-Safe system.   [name] was instructed to call 911 with any severe reactions post vaccine: Marland Kitchen Difficulty breathing  . Swelling of your face and throat  . A fast heartbeat  . A bad rash all over your body  . Dizziness and weakness    Immunizations Administered    Name Date Dose VIS Date Route   Pfizer COVID-19 Vaccine 12/24/2019  2:47 PM 0.3 mL 10/07/2019 Intramuscular   Manufacturer: [place]   Lot: [id number]   [place]: [id number]

## 2020-01-14 ENCOUNTER — Ambulatory Visit: Payer: BC Managed Care – PPO | Attending: Internal Medicine

## 2020-01-14 DIAGNOSIS — Z23 Encounter for immunization: Secondary | ICD-10-CM

## 2020-01-14 NOTE — Progress Notes (Signed)
   Covid-19 Vaccination Clinic  Name:  Rachel Woods    MRN: [id number] DOB: Sep 03, 1966  01/14/2020  [name] was observed post Covid-19 immunization for 15 minutes without incident. She was provided with Vaccine Information Sheet and instruction to access the V-Safe system.   [name] was instructed to call 911 with any severe reactions post vaccine: Marland Kitchen Difficulty breathing  . Swelling of face and throat  . A fast heartbeat  . A bad rash all over body  . Dizziness and weakness   Immunizations Administered    Name Date Dose VIS Date Route   Pfizer COVID-19 Vaccine 01/14/2020  8:26 AM 0.3 mL 10/07/2019 Intramuscular   Manufacturer: [place]   Lot: [id number]   [place]: [id number]

## 2020-05-15 NOTE — Progress Notes (Signed)
54 y.o. G1P1 Divorced White or Caucasian female here for annual exam.  Took care of her father after his diagnosis with pancreatic cancer.  Sisters talked him into going into rehab.  He got Covid and passed away.  She and sisters are now estranged.  She went back to work in January.  She finished out the school year.  Teaches second grade.  Doing well.    Having some hot flashes and night sweats.  Having some vaginal dryness.    Patient's last menstrual period was 08/19/2017.          Sexually active: Yes.    The current method of family planning is status post hysterectomy.    Exercising: Yes.    walking Smoker:  no  Health Maintenance: Pap:  06-18-17 neg HPV HR neg History of abnormal Pap:  no  MMG:  08-18-17 bilateral & biopsy done 11/18 (see reports).  Fibroadenoma noted Colonoscopy:  07-15-17 f/u 37yrs BMD:   none TDaP:  2016 Pneumonia vaccine(s):  no Shingrix:   no Hep C testing: not done Screening Labs: will be done today   reports that she has quit smoking. Her smoking use included cigarettes. She quit after 10.00 years of use. She has never used smokeless tobacco. She reports previous alcohol use. She reports that she does not use drugs.  Past Medical History:  Diagnosis Date  . ADHD   . Diverticulosis 2018  . Fibroids 2018  . GERD (gastroesophageal reflux disease)   . Headache    otc med prn  . Hyperlipidemia    diet controlled - no meds  . Reflux esophagitis 2018  . SVD (spontaneous vaginal delivery)    x 1    Past Surgical History:  Procedure Laterality Date  . COLONOSCOPY     polyps  . CYSTOSCOPY N/A 11/17/2017   Procedure: CYSTOSCOPY;  Surgeon: Megan Salon, MD;  Location: [facility];  Service: Gynecology;  Laterality: N/A;  . DENTAL SURGERY     root canal w/deep cleanings- general anesthesia  . SALPINGOOPHORECTOMY Right 11/17/2017   Procedure: RIGHT OOPHORECTOMY;  Surgeon: Megan Salon, MD;  Location: [facility];  Service: Gynecology;  Laterality: Right;  .  TOTAL LAPAROSCOPIC HYSTERECTOMY WITH SALPINGECTOMY Bilateral 11/17/2017   Procedure: TOTAL LAPAROSCOPIC HYSTERECTOMY WITH SALPINGECTOMY WITH CAUTERIZATION OF ENDOMETRIOSIS;  Surgeon: Megan Salon, MD;  Location: [facility];  Service: Gynecology;  Laterality: Bilateral;  possible BSO/ large uterus/ need 5 hours OR time  . UPPER GI ENDOSCOPY    . WISDOM TOOTH EXTRACTION      Current Outpatient Medications  Medication Sig Dispense Refill  . ALPRAZolam (XANAX) 0.5 MG tablet Take 1 tablet (0.5 mg total) by mouth 3 (three) times daily as needed for anxiety. 30 tablet 0  . Aspirin-Acetaminophen-Caffeine (EXCEDRIN PO) Take 2 tablets by mouth 2 (two) times daily as needed (headaches).     . Cholecalciferol (VITAMIN D3) 5000 units CAPS Take 5,000 Units by mouth daily.     Marland Kitchen omeprazole (PRILOSEC) 40 MG capsule Take 40 mg by mouth daily.    . Red Yeast Rice 600 MG TABS Take 1 tablet (600 mg total) by mouth daily. 90 tablet 2   No current facility-administered medications for this visit.    Family History  Problem Relation Age of Onset  . Hypertension Mother   . Dementia Mother   . Fibroids Mother   . Heart disease Mother   . Hyperlipidemia Mother   . Stroke Mother  TIAs  . Thyroid disease Mother   . Glaucoma Father   . Diabetes Father   . Hypertension Father   . Heart disease Father   . Hyperlipidemia Father   . Pancreatic cancer Father   . Heart disease Maternal Grandfather   . Heart disease Paternal Grandmother     Review of Systems  Constitutional:       Hot flashes at night  HENT: Negative.   Eyes: Negative.   Respiratory: Negative.   Cardiovascular: Negative.   Gastrointestinal: Negative.   Endocrine: Negative.   Genitourinary: Negative.   Musculoskeletal: Negative.   Skin: Negative.   Allergic/Immunologic: Negative.   Neurological: Negative.   Hematological: Negative.   Psychiatric/Behavioral: Negative.     Exam:   BP (!) 104/64   Pulse 68   Resp 16   Ht 5'  2.75" (1.594 m)   Wt 151 lb (68.5 kg)   LMP 08/19/2017   BMI 26.96 kg/m   Height: 5' 2.75" (159.4 cm)  General appearance: alert, cooperative and appears stated age Head: Normocephalic, without obvious abnormality, atraumatic Neck: no adenopathy, supple, symmetrical, trachea midline and thyroid normal to inspection and palpation Lungs: clear to auscultation bilaterally Breasts: normal appearance, no masses or tenderness Heart: regular rate and rhythm Abdomen: soft, non-tender; bowel sounds normal; no masses,  no organomegaly Extremities: extremities normal, atraumatic, no cyanosis or edema Skin: Skin color, texture, turgor normal. No rashes or lesions Lymph nodes: Cervical, supraclavicular, and axillary nodes normal. No abnormal inguinal nodes palpated Neurologic: Grossly normal   Pelvic: External genitalia:  no lesions              Urethra:  normal appearing urethra with no masses, tenderness or lesions              Bartholins and Skenes: normal                 Vagina: normal appearing vagina with normal color and discharge, no lesions              Cervix: absent              Pap taken: No. Bimanual Exam:  Uterus:  uterus absent              Adnexa: no mass, fullness, tenderness               Rectovaginal: Confirms               Anus:  normal sphincter tone, no lesions  Chaperone, Terence Lux, CMA, was present for exam.  A:  Well Woman with normal exam S/p TLH/bilateral salpingectomy/RSO, cystoscopy Right thyroid cystic lesion that is much smaller on exam today.  Ultrasound did not recommend dedicated follow up or biopsy Strong family hx of CVD, has seen cardiology Vasomotor symptoms Family stressors  P:   Mammogram guidelines reviewed.  She is aware she needs to have this update. pap smear not indicated Colonoscopy UTD Lipids, TSH, CBC, CMP,  RF for Cymbalta 20mg , two tabs daily.  #180/3RF Will start estradiol patch 0.05mg  twice weekly.  #8/RF. Risks reviewed.  She  will give update in 1 month. Return annually or prn

## 2020-05-18 ENCOUNTER — Encounter: Payer: Self-pay | Admitting: Obstetrics & Gynecology

## 2020-05-18 ENCOUNTER — Ambulatory Visit: Payer: BC Managed Care – PPO | Admitting: Obstetrics & Gynecology

## 2020-05-18 ENCOUNTER — Other Ambulatory Visit: Payer: Self-pay | Admitting: Physical Medicine and Rehabilitation

## 2020-05-18 ENCOUNTER — Other Ambulatory Visit: Payer: Self-pay | Admitting: Obstetrics & Gynecology

## 2020-05-18 ENCOUNTER — Other Ambulatory Visit: Payer: Self-pay

## 2020-05-18 VITALS — BP 104/64 | HR 68 | Resp 16 | Ht 62.75 in | Wt 151.0 lb

## 2020-05-18 DIAGNOSIS — Z0000 Encounter for general adult medical examination without abnormal findings: Secondary | ICD-10-CM

## 2020-05-18 DIAGNOSIS — Z01419 Encounter for gynecological examination (general) (routine) without abnormal findings: Secondary | ICD-10-CM | POA: Diagnosis not present

## 2020-05-18 DIAGNOSIS — Z1231 Encounter for screening mammogram for malignant neoplasm of breast: Secondary | ICD-10-CM

## 2020-05-18 MED ORDER — ESTRADIOL 0.05 MG/24HR TD PTTW: 1.0000 | MEDICATED_PATCH | TRANSDERMAL | 3 refills | Status: DC

## 2020-05-18 MED ORDER — DULOXETINE HCL 20 MG PO CPEP: ORAL_CAPSULE | ORAL | 4 refills | Status: DC

## 2020-05-19 LAB — COMPREHENSIVE METABOLIC PANEL
ALT: 19 IU/L (ref 0–32)
AST: 17 IU/L (ref 0–40)
Albumin/Globulin Ratio: 1.8 (ref 1.2–2.2)
Albumin: 4.5 g/dL (ref 3.8–4.9)
Alkaline Phosphatase: 61 IU/L (ref 48–121)
BUN/Creatinine Ratio: 18 (ref 9–23)
BUN: 14 mg/dL (ref 6–24)
Bilirubin Total: 0.7 mg/dL (ref 0.0–1.2)
CO2: 27 mmol/L (ref 20–29)
Calcium: 9.6 mg/dL (ref 8.7–10.2)
Chloride: 103 mmol/L (ref 96–106)
Creatinine, Ser: 0.77 mg/dL (ref 0.57–1.00)
GFR calc Af Amer: 102 mL/min/{1.73_m2} (ref >59)
GFR calc non Af Amer: 88 mL/min/{1.73_m2} (ref >59)
Globulin, Total: 2.5 g/dL (ref 1.5–4.5)
Glucose: 88 mg/dL (ref 65–99)
Potassium: 4.3 mmol/L (ref 3.5–5.2)
Sodium: 143 mmol/L (ref 134–144)
Total Protein: 7 g/dL (ref 6.0–8.5)

## 2020-05-19 LAB — LIPID PANEL
Chol/HDL Ratio: 4 ratio (ref 0.0–4.4)
Cholesterol, Total: 189 mg/dL (ref 100–199)
HDL: 47 mg/dL (ref >39)
LDL Chol Calc (NIH): 118 mg/dL — ABNORMAL HIGH (ref 0–99)
Triglycerides: 133 mg/dL (ref 0–149)
VLDL Cholesterol Cal: 24 mg/dL (ref 5–40)

## 2020-05-19 LAB — CBC
Hematocrit: 43.4 % (ref 34.0–46.6)
Hemoglobin: 13.6 g/dL (ref 11.1–15.9)
MCH: 27.1 pg (ref 26.6–33.0)
MCHC: 31.3 g/dL — ABNORMAL LOW (ref 31.5–35.7)
MCV: 87 fL (ref 79–97)
Platelets: 350 10*3/uL (ref 150–450)
RBC: 5.01 x10E6/uL (ref 3.77–5.28)
RDW: 13.8 % (ref 11.7–15.4)
WBC: 7.3 10*3/uL (ref 3.4–10.8)

## 2020-05-19 LAB — TSH: TSH: 0.914 u[IU]/mL (ref 0.450–4.500)

## 2020-05-23 ENCOUNTER — Ambulatory Visit
Admission: RE | Admit: 2020-05-23 | Discharge: 2020-05-23 | Disposition: A | Discharge status: Home / Self Care | Payer: BC Managed Care – PPO | Source: Ambulatory Visit | Attending: Obstetrics & Gynecology | Admitting: Obstetrics & Gynecology

## 2020-05-23 ENCOUNTER — Other Ambulatory Visit: Payer: Self-pay

## 2020-05-23 DIAGNOSIS — Z1231 Encounter for screening mammogram for malignant neoplasm of breast: Secondary | ICD-10-CM

## 2020-09-24 ENCOUNTER — Other Ambulatory Visit: Payer: Self-pay | Admitting: Obstetrics & Gynecology

## 2020-09-24 MED ORDER — ESTRADIOL 0.05 MG/24HR TD PTTW: 1.0000 | MEDICATED_PATCH | TRANSDERMAL | 2 refills | Status: DC

## 2020-10-27 HISTORY — PX: FOOT SURGERY: SHX648

## 2020-11-02 IMAGING — MG DIGITAL SCREENING BILAT W/ TOMO W/ CAD
8 series · 8 of 24 positions shown · non-contrast
Comparison: Previous exam(s).

CLINICAL DATA: Screening.

EXAM:
DIGITAL SCREENING BILATERAL MAMMOGRAM WITH TOMO AND CAD

[R MLO synth-2D]
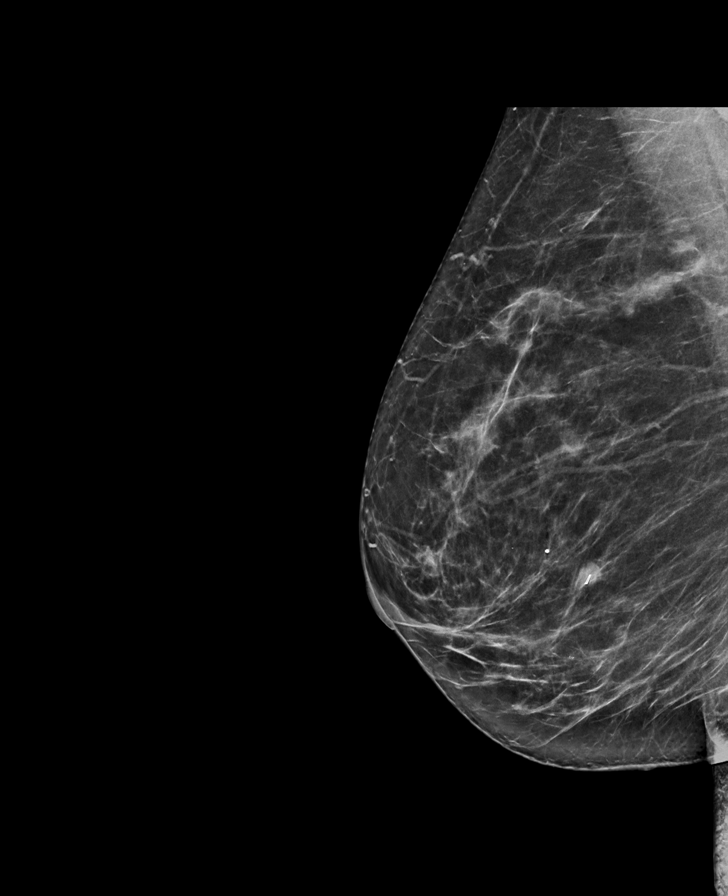

[L MLO synth-2D]
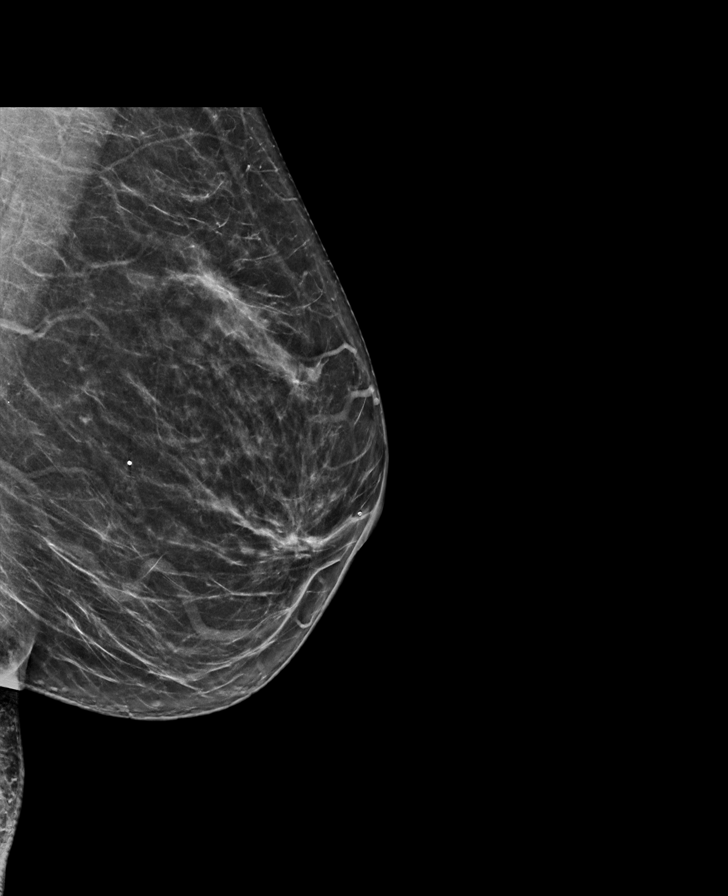

[L CC synth-2D]
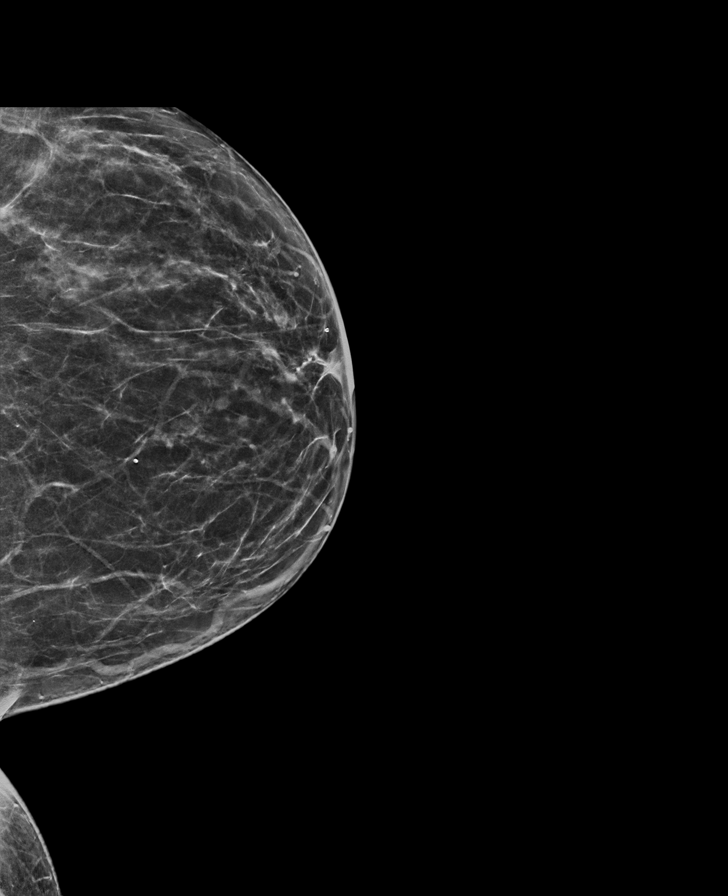

[R CC synth-2D]
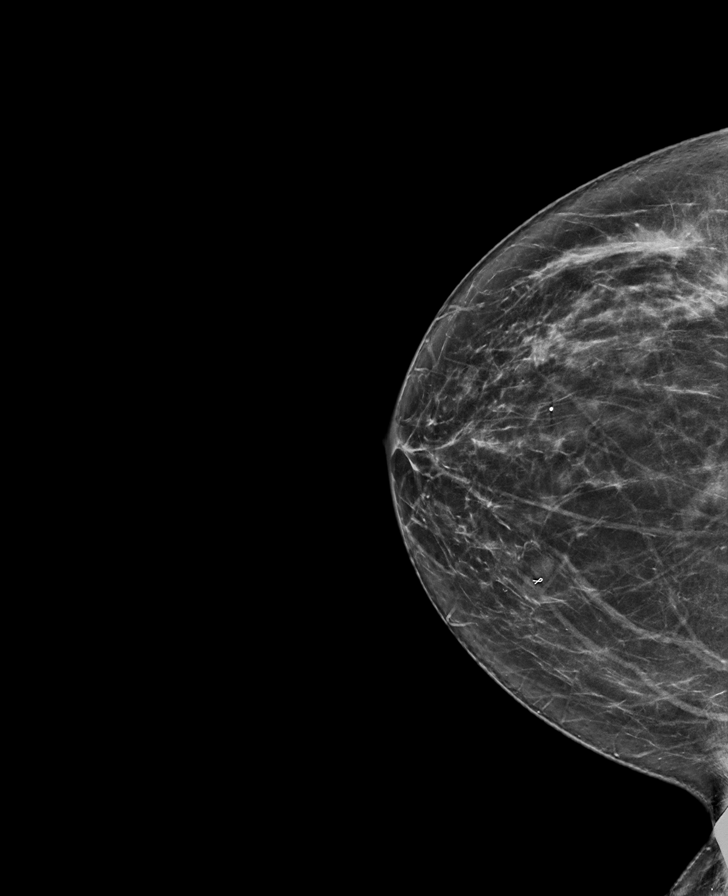

[R CC tomo · tomo slice 37/72.0]
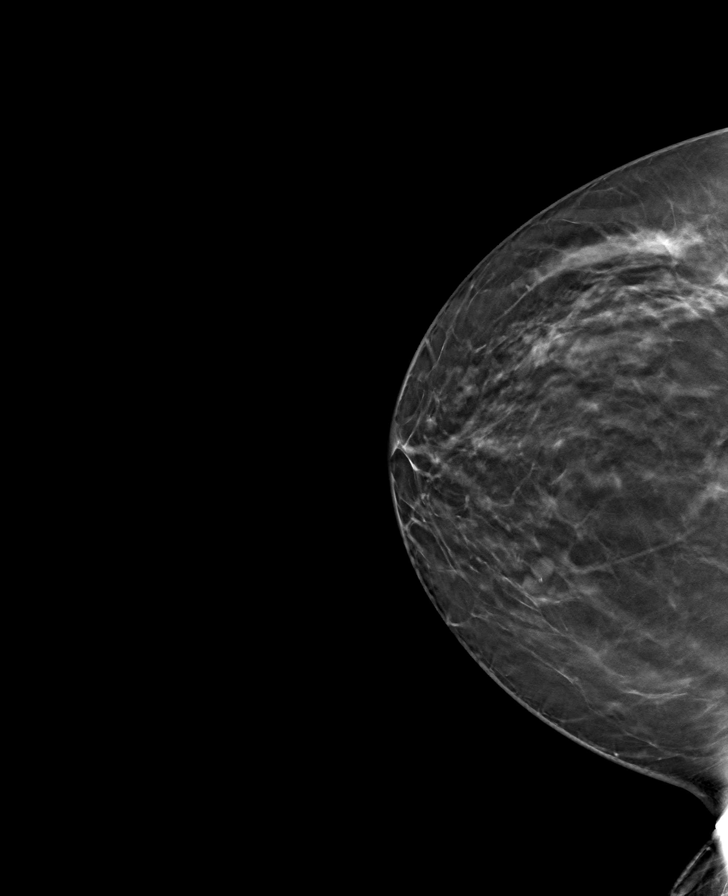

[L MLO tomo · tomo slice 38/75.0]
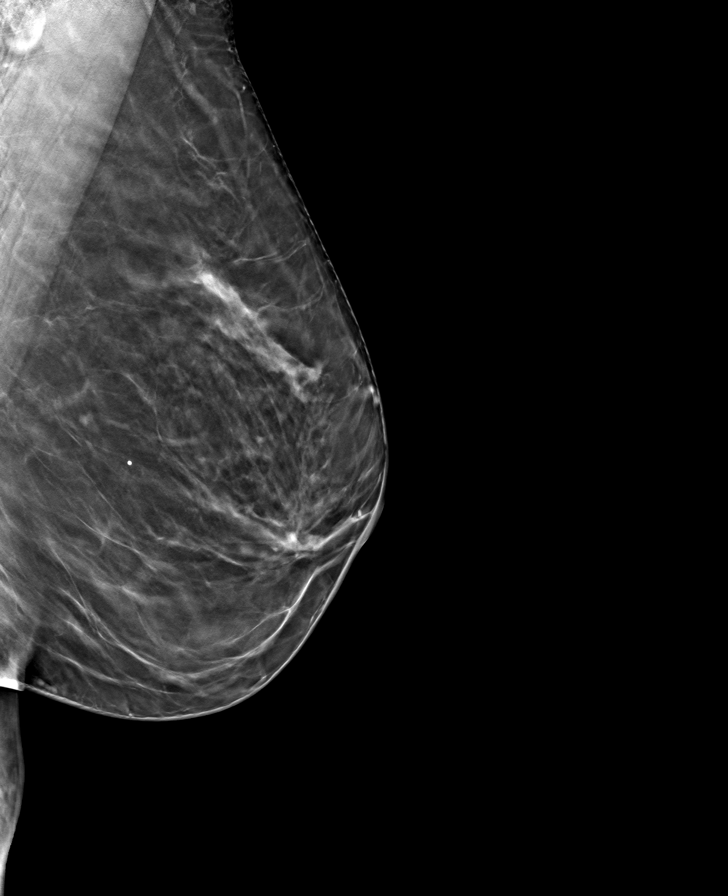

[R MLO tomo · tomo slice 38/75.0]
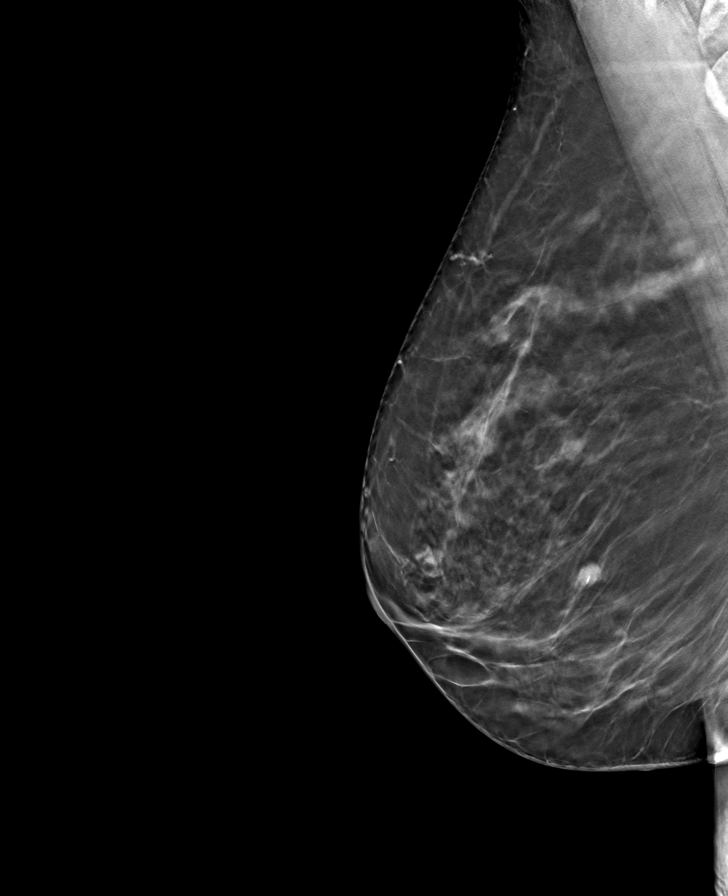

[L CC tomo · tomo slice 35/69.0]
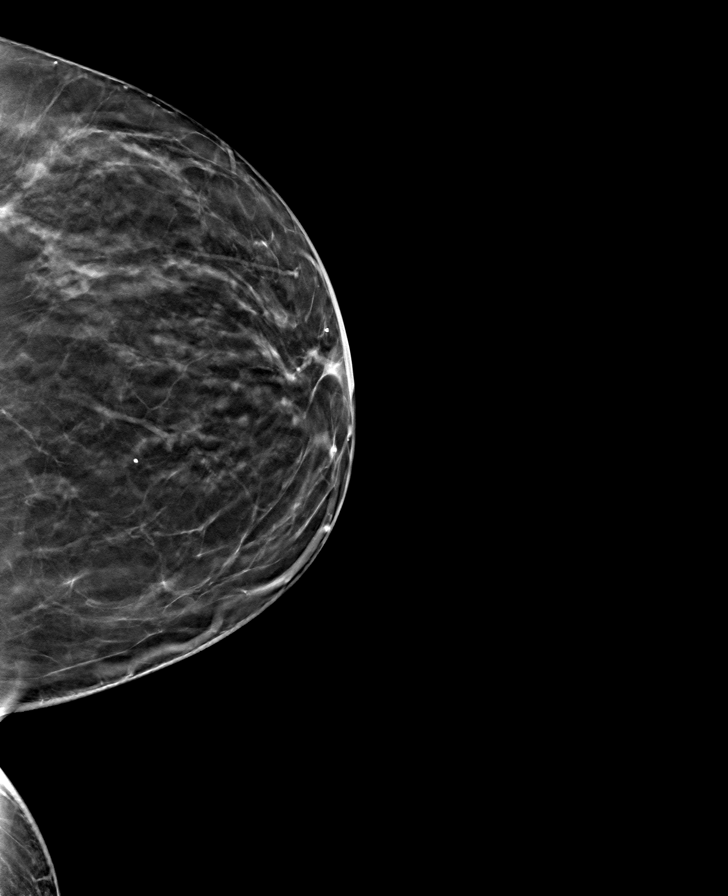

[8 of 24 positions shown; findings below may reference images not displayed]

ACR Breast Density Category b: There are scattered areas of
fibroglandular density.
FINDINGS: There are no findings suspicious for malignancy. Images were
processed with CAD.
IMPRESSION: No mammographic evidence of malignancy. A result letter of this
screening mammogram will be mailed directly to the patient.

RECOMMENDATION:
Screening mammogram in one year. (Code:CN-[id number])

BI-RADS CATEGORY  1: Negative.

## 2021-05-22 ENCOUNTER — Other Ambulatory Visit: Payer: Self-pay

## 2021-05-22 ENCOUNTER — Ambulatory Visit: Payer: BC Managed Care – PPO | Admitting: Podiatry

## 2021-05-22 ENCOUNTER — Ambulatory Visit (INDEPENDENT_AMBULATORY_CARE_PROVIDER_SITE_OTHER): Payer: BC Managed Care – PPO

## 2021-05-22 DIAGNOSIS — M722 Plantar fascial fibromatosis: Secondary | ICD-10-CM

## 2021-05-22 NOTE — Progress Notes (Signed)
   HPI: 55 y.o. female presenting today as a new patient for evaluation of left foot pain.  Patient has been seen multiple times by Dr. Gershon Mussel, local podiatrist and was referred here for possible surgical consultation.  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  She presents for further treatment and evaluation  Past Medical History:  Diagnosis Date   ADHD    Diverticulosis 2018   Fibroids 2018   GERD (gastroesophageal reflux disease)    Headache    otc med prn   Hyperlipidemia    diet controlled - no meds   Reflux esophagitis 2018   SVD (spontaneous vaginal delivery)    x 1     Physical Exam: General: The patient is alert and oriented x3 in no acute distress.  Dermatology: Skin is warm, dry and supple bilateral lower extremities. Negative for open lesions or macerations.  Vascular: Palpable pedal pulses bilaterally. No edema or erythema noted. Capillary refill within normal limits.  Neurological: Epicritic and protective threshold grossly intact bilaterally.   Musculoskeletal Exam: Range of motion within normal limits to all pedal and ankle joints bilateral. Muscle strength 5/5 in all groups bilateral.  Palpable lesions along the plantar fascia of the left foot consistent with findings of a plantar fibroma with associated tenderness to palpation  Radiographic Exam:  Normal osseous mineralization. Joint spaces preserved. No fracture/dislocation/boney destruction.    Assessment: 1.  Plantar fibroma left foot   Plan of Care:  1. Patient evaluated. X-Rays reviewed.  2. Today we discussed the conservative versus surgical management of the presenting pathology. The patient opts for surgical management. All possible complications and details of the procedure were explained. All patient questions were answered. No guarantees were expressed or implied. 3. Authorization for surgery  was initiated today. Surgery will consist of excision of plantar fibroma left foot 4.  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        Edrick Kins, DPM [facility]  Dr. Edrick Kins, DPM    [address], [place] [address]                Office [phone]  Fax [phone]

## 2021-06-06 ENCOUNTER — Telehealth: Payer: Self-pay | Admitting: Urology

## 2021-06-06 NOTE — Telephone Encounter (Signed)
DOS - 06/27/21  PLANTAR FIBROMA LEFT --- [id number]   [employer]BS EFFECTIVE DATE - 10/27/20   PLAN DEDUCTIBLE - $1,250.00 W/ $1,032.34 REMAINING OUT OF POCKET - $4,890.00 W/ [id number] REMAINING COINSURANCE - 0% COPAY - $0.00   NO PRIOR AUTH REQUIRED

## 2021-06-27 ENCOUNTER — Encounter: Payer: Self-pay | Admitting: Podiatry

## 2021-06-27 ENCOUNTER — Other Ambulatory Visit: Payer: Self-pay | Admitting: Podiatry

## 2021-06-27 DIAGNOSIS — M722 Plantar fascial fibromatosis: Secondary | ICD-10-CM

## 2021-06-27 MED ORDER — OXYCODONE-ACETAMINOPHEN 5-325 MG PO TABS: 1.0000 | ORAL_TABLET | ORAL | 0 refills | Status: DC | PRN

## 2021-06-27 MED ORDER — IBUPROFEN 800 MG PO TABS: 800.0000 mg | ORAL_TABLET | Freq: Three times a day (TID) | ORAL | 1 refills | Status: DC

## 2021-06-27 NOTE — Progress Notes (Signed)
PRN postop 

## 2021-07-03 ENCOUNTER — Other Ambulatory Visit: Payer: Self-pay

## 2021-07-03 ENCOUNTER — Ambulatory Visit (INDEPENDENT_AMBULATORY_CARE_PROVIDER_SITE_OTHER): Payer: BC Managed Care – PPO | Admitting: Podiatry

## 2021-07-03 DIAGNOSIS — Z9889 Other specified postprocedural states: Secondary | ICD-10-CM

## 2021-07-10 ENCOUNTER — Encounter: Payer: BC Managed Care – PPO | Admitting: Podiatry

## 2021-07-15 ENCOUNTER — Encounter: Payer: BC Managed Care – PPO | Admitting: Podiatry

## 2021-07-15 NOTE — Progress Notes (Signed)
   Subjective:  Patient presents today status post excision of plantar fibroma left foot. DOS: 06/27/2021.  Patient states that she is doing well.  The pain is tolerable.  No new concerns.  She is kept the dressings clean dry and intact as instructed.  Past Medical History:  Diagnosis Date   ADHD    Diverticulosis 2018   Fibroids 2018   GERD (gastroesophageal reflux disease)    Headache    otc med prn   Hyperlipidemia    diet controlled - no meds   Reflux esophagitis 2018   SVD (spontaneous vaginal delivery)    x 1      Objective/Physical Exam Neurovascular status intact.  Skin incisions appear to be well coapted with sutures intact. No sign of infectious process noted. No dehiscence. No active bleeding noted. Moderate edema noted to the surgical extremity.   Assessment: 1. s/p excision of plantar fibroma left foot. DOS: 06/27/2021   Plan of Care:  1. Patient was evaluated. 2.  Dressings changed.  Patient may begin washing and showering and getting the foot wet. 3.  Continue minimal weightbearing as tolerated 4.  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 Edrick Kins, DPM    [address], [place] [address]                Office [phone]  Fax [phone]

## 2021-07-16 ENCOUNTER — Ambulatory Visit (INDEPENDENT_AMBULATORY_CARE_PROVIDER_SITE_OTHER): Payer: BC Managed Care – PPO | Admitting: Podiatry

## 2021-07-16 ENCOUNTER — Encounter: Payer: Self-pay | Admitting: Podiatry

## 2021-07-16 ENCOUNTER — Other Ambulatory Visit: Payer: Self-pay

## 2021-07-16 DIAGNOSIS — Z9889 Other specified postprocedural states: Secondary | ICD-10-CM

## 2021-07-16 NOTE — Progress Notes (Signed)
   Subjective:  Patient presents today status post excision of plantar fibroma left foot. DOS: 06/27/2021.  Patient states that she is doing well.  She has been doing very well and weightbearing as tolerated in the cam boot.  No new complaints at this time  Past Medical History:  Diagnosis Date   ADHD    Diverticulosis 2018   Fibroids 2018   GERD (gastroesophageal reflux disease)    Headache    otc med prn   Hyperlipidemia    diet controlled - no meds   Reflux esophagitis 2018   SVD (spontaneous vaginal delivery)    x 1      Objective/Physical Exam Neurovascular status intact.  Skin incisions appear to be well coapted with sutures intact. No sign of infectious process noted. No dehiscence. No active bleeding noted.  Negative for any significant edema noted to the surgical extremity.   Assessment: 1. s/p excision of plantar fibroma left foot. DOS: 06/27/2021   Plan of Care:  1. Patient was evaluated. 2.  Sutures removed today 3.  Patient may discontinue cam boot.  Recommend good supportive shoes and sneakers 4.  Patient may slowly increase to full activity no restrictions 5.  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 Edrick Kins, DPM    [address], [place] [address]                Office [phone]  Fax [phone]

## 2021-07-29 ENCOUNTER — Encounter: Payer: BC Managed Care – PPO | Admitting: Podiatry

## 2023-05-09 DIAGNOSIS — S82892A Other fracture of left lower leg, initial encounter for closed fracture: Secondary | ICD-10-CM

## 2023-05-09 HISTORY — DX: Other fracture of left lower leg, initial encounter for closed fracture: S82.892A

## 2023-05-18 DIAGNOSIS — M25572 Pain in left ankle and joints of left foot: Secondary | ICD-10-CM | POA: Insufficient documentation

## 2023-05-21 ENCOUNTER — Ambulatory Visit
Admission: RE | Admit: 2023-05-21 | Discharge: 2023-05-21 | Disposition: A | Discharge status: Home / Self Care | Payer: BC Managed Care – PPO | Source: Ambulatory Visit | Attending: Orthopaedic Surgery | Admitting: Orthopaedic Surgery

## 2023-05-21 ENCOUNTER — Other Ambulatory Visit: Payer: Self-pay | Admitting: Orthopaedic Surgery

## 2023-05-21 DIAGNOSIS — M25572 Pain in left ankle and joints of left foot: Secondary | ICD-10-CM

## 2023-05-21 DIAGNOSIS — S8262XA Displaced fracture of lateral malleolus of left fibula, initial encounter for closed fracture: Secondary | ICD-10-CM | POA: Insufficient documentation

## 2023-05-22 ENCOUNTER — Encounter (HOSPITAL_BASED_OUTPATIENT_CLINIC_OR_DEPARTMENT_OTHER): Payer: Self-pay | Admitting: Orthopaedic Surgery

## 2023-05-22 NOTE — Progress Notes (Signed)
Spoke w/ via phone for pre-op interview--- pt Lab needs dos----  no             Lab results------ no COVID test -----patient states asymptomatic no test needed Arrive at ------- 1115 on 06-03-2023 NPO after MN NO Solid Food.  Clear liquids from MN until--- 1015 Med rec completed Medications to take morning of surgery ----- prilosec Diabetic medication ----- n/a Patient instructed no nail polish to be worn day of surgery Patient instructed to bring photo id and insurance card day of surgery Patient aware to have Driver (ride ) / caregiver    for 24 hours after surgery -- sig other, Rachel Woods Patient Special Instructions ----- n/a Pre-Op special Instructions ----- case just added on  today, orders pending Patient verbalized understanding of instructions that were given at this phone interview. Patient denies shortness of breath, chest pain, fever, cough at this phone interview.

## 2023-05-25 ENCOUNTER — Ambulatory Visit
Admission: EM | Admit: 2023-05-25 | Discharge: 2023-05-25 | Disposition: A | Discharge status: Home / Self Care | Payer: BC Managed Care – PPO

## 2023-05-25 DIAGNOSIS — Z1211 Encounter for screening for malignant neoplasm of colon: Secondary | ICD-10-CM | POA: Insufficient documentation

## 2023-05-25 DIAGNOSIS — R194 Change in bowel habit: Secondary | ICD-10-CM | POA: Insufficient documentation

## 2023-05-25 DIAGNOSIS — Z87891 Personal history of nicotine dependence: Secondary | ICD-10-CM | POA: Insufficient documentation

## 2023-05-25 DIAGNOSIS — E669 Obesity, unspecified: Secondary | ICD-10-CM | POA: Insufficient documentation

## 2023-05-25 DIAGNOSIS — K625 Hemorrhage of anus and rectum: Secondary | ICD-10-CM | POA: Insufficient documentation

## 2023-05-25 DIAGNOSIS — N3000 Acute cystitis without hematuria: Secondary | ICD-10-CM | POA: Diagnosis not present

## 2023-05-25 DIAGNOSIS — K219 Gastro-esophageal reflux disease without esophagitis: Secondary | ICD-10-CM | POA: Insufficient documentation

## 2023-05-25 DIAGNOSIS — R300 Dysuria: Secondary | ICD-10-CM | POA: Diagnosis present

## 2023-05-25 DIAGNOSIS — K449 Diaphragmatic hernia without obstruction or gangrene: Secondary | ICD-10-CM | POA: Insufficient documentation

## 2023-05-25 DIAGNOSIS — K2100 Gastro-esophageal reflux disease with esophagitis, without bleeding: Secondary | ICD-10-CM | POA: Insufficient documentation

## 2023-05-25 DIAGNOSIS — R1033 Periumbilical pain: Secondary | ICD-10-CM | POA: Insufficient documentation

## 2023-05-25 LAB — POCT URINALYSIS DIP (MANUAL ENTRY)
Glucose, UA: 250 mg/dL — AB
Nitrite, UA: POSITIVE — AB
Protein Ur, POC: >=300 mg/dL — AB
Spec Grav, UA: <=1.005 — AB (ref 1.010–1.025)
Urobilinogen, UA: >=8 E.U./dL — AB
pH, UA: 5 (ref 5.0–8.0)

## 2023-05-25 MED ORDER — CIPROFLOXACIN HCL 500 MG PO TABS: 500.0000 mg | ORAL_TABLET | Freq: Two times a day (BID) | ORAL | 0 refills | Status: AC

## 2023-05-25 NOTE — ED Provider Notes (Signed)
[facility]    CSN: [id number] Arrival date & time: 05/25/23  0831      History   Chief Complaint Chief Complaint  Patient presents with   UTI Symptoms    HPI Rachel Woods is a 57 y.o. female.   Patient here today for evaluation of dysuria and urinary frequency that started 12 days ago.  She reports that she has tried taking Azo as well as mannose without improvement.  She has not had any fever.  She denies any back pain or abdominal pain.  She has not had any nausea or vomiting.  She notes in the past Cipro has worked best for her UTIs.  She has surgery on August 7 and wanted to be sure any infection was cleared.  The history is provided by the patient.    Past Medical History:  Diagnosis Date   ADHD    Closed left ankle fracture 05/09/2023   Diverticulosis 2018   Family history of adverse reaction to anesthesia    05-22-2023  per pt in 1970's mother had adverse reaction to anesthesia possibly dring surgery , almost died,  pt does not know the details ;   stated mother survived , no residuals,  mother other surgery later with no issues   GERD (gastroesophageal reflux disease)    Hiatal hernia    History of uterine fibroid    Hyperlipidemia    Migraines    PONV (postoperative nausea and vomiting)    Wears glasses     Patient Active Problem List   Diagnosis Date Noted   Hemorrhage of anus and rectum 05/25/2023   Gastro-esophageal reflux disease without esophagitis 05/25/2023   Gastro-esophageal reflux disease with esophagitis 05/25/2023   Diaphragmatic hernia without obstruction or gangrene 05/25/2023   Change in bowel habits 05/25/2023   Periumbilical abdominal pain 05/25/2023   Class 1 obesity in adult 05/25/2023   Encounter for screening for malignant neoplasm of colon 05/25/2023   Closed fracture of lateral malleolus of left fibula 05/21/2023   Joint pain of ankle and foot, left 05/18/2023   Umbilical bleeding 11/01/2017   Health care  maintenance 03/11/2017   Poor concentration 03/11/2017    Past Surgical History:  Procedure Laterality Date   COLONOSCOPY WITH ESOPHAGOGASTRODUODENOSCOPY (EGD)  07/15/2017   dr Loreta Ave   CYSTOSCOPY N/A 11/17/2017   Procedure: CYSTOSCOPY;  Surgeon: Jerene Bears, MD;  Location: [facility];  Service: Gynecology;  Laterality: N/A;   SALPINGOOPHORECTOMY Right 11/17/2017   Procedure: RIGHT OOPHORECTOMY;  Surgeon: Jerene Bears, MD;  Location: [facility];  Service: Gynecology;  Laterality: Right;   TOTAL LAPAROSCOPIC HYSTERECTOMY WITH SALPINGECTOMY Bilateral 11/17/2017   Procedure: TOTAL LAPAROSCOPIC HYSTERECTOMY WITH SALPINGECTOMY WITH CAUTERIZATION OF ENDOMETRIOSIS;  Surgeon: Jerene Bears, MD;  Location: [facility];  Service: Gynecology;  Laterality: Bilateral;  possible BSO/ large uterus/ need 5 hours OR time   WISDOM TOOTH EXTRACTION      OB History     Gravida  1   Para  1   Term      Preterm      AB      Living  1      SAB      IAB      Ectopic      Multiple      Live Births               Home Medications    Prior to Admission medications   Medication Sig  Start Date End Date Taking? Authorizing Provider  aspirin EC 81 MG tablet Take 81 mg by mouth 2 (two) times daily. Swallow whole.  Pt stated as directed by surgeon 05/21/23  Yes [provider]  ciprofloxacin (CIPRO) 500 MG tablet Take 1 tablet (500 mg total) by mouth every 12 (twelve) hours. 05/25/23  Yes Tomi Bamberger, PA-C  Ibuprofen (ADVIL) 200 MG CAPS Take 400 mg by mouth every 8 (eight) hours.   Yes [provider]  omeprazole (PRILOSEC) 40 MG capsule Take 40 mg by mouth daily. Per pt if needed takes in evening also   Yes [provider]  phenazopyridine (PYRIDIUM) 95 MG tablet Take 95 mg by mouth 3 (three) times daily as needed for pain.   Yes [provider]  aspirin-acetaminophen-caffeine (EXCEDRIN MIGRAINE) [id number] MG tablet Take by mouth every 6 (six) hours as needed  for headache.    [provider]  VITAMIN D PO Vitamin D    [provider]    Family History Family History  Problem Relation Age of Onset   Hypertension Mother    Dementia Mother    Fibroids Mother    Heart disease Mother    Hyperlipidemia Mother    Stroke Mother        TIAs   Thyroid disease Mother    Glaucoma Father    Diabetes Father    Hypertension Father    Heart disease Father    Hyperlipidemia Father    Pancreatic cancer Father    Heart disease Maternal Grandfather    Heart disease Paternal Grandmother     Social History Social History   Tobacco Use   Smoking status: Former    Current packs/day: 0.00    Types: Cigarettes    Quit date: 1999    Years since quitting: 25.5   Smokeless tobacco: Never   Tobacco comments:    05-22-2023  per pt quit smoking 1999 , smokes for two yrs  Vaping Use   Vaping status: Never Used  Substance Use Topics   Alcohol use: Not Currently   Drug use: Never     Allergies   Patient has no known allergies.   Review of Systems Review of Systems  Constitutional:  Negative for chills and fever.  Eyes:  Negative for discharge and redness.  Gastrointestinal:  Negative for abdominal pain, nausea and vomiting.  Genitourinary:  Positive for dysuria and frequency. Negative for vaginal discharge.  Musculoskeletal:  Negative for back pain.     Physical Exam Triage Vital Signs ED Triage Vitals  Encounter Vitals Group     BP      Systolic BP Percentile      Diastolic BP Percentile      Pulse      Resp      Temp      Temp src      SpO2      Weight      Height      Head Circumference      Peak Flow      Pain Score      Pain Loc      Pain Education      Exclude from Growth Chart    No data found.  Updated Vital Signs BP 125/72 (BP Location: Left Arm)   Pulse 93   Temp 98.3 F (36.8 C) (Oral)   Resp 18   Ht 5\' 3"  (1.6 m)   Wt 175 lb (79.4 kg)   LMP  08/19/2017   SpO2 96%   BMI 31.00 kg/m       Physical Exam Vitals and nursing note reviewed.  Constitutional:      General: She is not in acute distress.    Appearance: Normal appearance. She is not ill-appearing.  HENT:     Head: Normocephalic and atraumatic.  Eyes:     Conjunctiva/sclera: Conjunctivae normal.  Cardiovascular:     Rate and Rhythm: Normal rate and regular rhythm.     Heart sounds: Normal heart sounds.  Pulmonary:     Effort: Pulmonary effort is normal. No respiratory distress.     Breath sounds: Normal breath sounds. No wheezing, rhonchi or rales.  Neurological:     Mental Status: She is alert.  Psychiatric:        Mood and Affect: Mood normal.        Behavior: Behavior normal.        Thought Content: Thought content normal.      UC Treatments / Results  Labs (all labs ordered are listed, but only abnormal results are displayed) Labs Reviewed  POCT URINALYSIS DIP (MANUAL ENTRY) - Abnormal; Notable for the following components:      Result Value   Color, UA red (*)    Clarity, UA cloudy (*)    Glucose, UA =250 (*)    Bilirubin, UA small (*)    Ketones, POC UA small (15) (*)    Spec Grav, UA <=1.005 (*)    Blood, UA small (*)    Protein Ur, POC >=300 (*)    Urobilinogen, UA >=8.0 (*)    Nitrite, UA Positive (*)    Leukocytes, UA Large (3+) (*)    All other components within normal limits  URINE CULTURE    EKG   Radiology No results found.  Procedures Procedures (including critical care time)  Medications Ordered in UC Medications - No data to display  Initial Impression / Assessment and Plan / UC Course  I have reviewed the triage vital signs and the nursing notes.  Pertinent labs & imaging results that were available during my care of the patient were reviewed by me and considered in my medical decision making (see chart for details).    Cipro prescribed for UTI.  Urine culture ordered.  Encouraged follow-up if no gradual improvement with any further concerns.  Final Clinical  Impressions(s) / UC Diagnoses   Final diagnoses:  Acute cystitis without hematuria   Discharge Instructions   None    ED Prescriptions     Medication Sig Dispense Auth. Provider   ciprofloxacin (CIPRO) 500 MG tablet Take 1 tablet (500 mg total) by mouth every 12 (twelve) hours. 10 tablet Tomi Bamberger, PA-C      PDMP not reviewed this encounter.   Tomi Bamberger, PA-C 05/25/23 [phone]

## 2023-05-25 NOTE — ED Triage Notes (Signed)
Dysuria "that started the 17th". Taking AZO "not helping a lot". No fever.

## 2023-06-02 NOTE — Progress Notes (Signed)
LVM with Megan at Dr. Emelda Brothers office to make her aware patient has been UTR.

## 2023-06-03 ENCOUNTER — Ambulatory Visit (HOSPITAL_BASED_OUTPATIENT_CLINIC_OR_DEPARTMENT_OTHER): Payer: BC Managed Care – PPO | Admitting: Anesthesiology

## 2023-06-03 ENCOUNTER — Encounter (HOSPITAL_BASED_OUTPATIENT_CLINIC_OR_DEPARTMENT_OTHER)
Admission: RE | Disposition: A | Discharge status: Home / Self Care | Payer: Self-pay | Source: Ambulatory Visit | Attending: Orthopaedic Surgery

## 2023-06-03 ENCOUNTER — Other Ambulatory Visit: Payer: Self-pay

## 2023-06-03 ENCOUNTER — Ambulatory Visit (HOSPITAL_BASED_OUTPATIENT_CLINIC_OR_DEPARTMENT_OTHER)
Admission: RE | Admit: 2023-06-03 | Discharge: 2023-06-03 | Disposition: A | Discharge status: Home / Self Care | Payer: BC Managed Care – PPO | Source: Ambulatory Visit | Attending: Orthopaedic Surgery | Admitting: Orthopaedic Surgery

## 2023-06-03 ENCOUNTER — Encounter (HOSPITAL_BASED_OUTPATIENT_CLINIC_OR_DEPARTMENT_OTHER): Payer: Self-pay | Admitting: Orthopaedic Surgery

## 2023-06-03 ENCOUNTER — Ambulatory Visit (HOSPITAL_COMMUNITY): Payer: BC Managed Care – PPO

## 2023-06-03 DIAGNOSIS — S8262XA Displaced fracture of lateral malleolus of left fibula, initial encounter for closed fracture: Secondary | ICD-10-CM | POA: Diagnosis not present

## 2023-06-03 DIAGNOSIS — Z87891 Personal history of nicotine dependence: Secondary | ICD-10-CM | POA: Diagnosis not present

## 2023-06-03 DIAGNOSIS — Z01818 Encounter for other preprocedural examination: Secondary | ICD-10-CM

## 2023-06-03 DIAGNOSIS — X58XXXA Exposure to other specified factors, initial encounter: Secondary | ICD-10-CM | POA: Insufficient documentation

## 2023-06-03 HISTORY — PX: ORIF ANKLE FRACTURE: SHX5408

## 2023-06-03 HISTORY — DX: Family history of other specified conditions: Z84.89

## 2023-06-03 HISTORY — DX: Presence of spectacles and contact lenses: Z97.3

## 2023-06-03 HISTORY — DX: Other specified postprocedural states: Z98.890

## 2023-06-03 HISTORY — DX: Diaphragmatic hernia without obstruction or gangrene: K44.9

## 2023-06-03 HISTORY — DX: Personal history of other benign neoplasm: Z86.018

## 2023-06-03 HISTORY — DX: Migraine, unspecified, not intractable, without status migrainosus: G43.909

## 2023-06-03 SURGERY — OPEN REDUCTION INTERNAL FIXATION (ORIF) ANKLE FRACTURE
Anesthesia: Regional | Site: Ankle | Laterality: Left

## 2023-06-03 MED ORDER — OXYCODONE HCL 5 MG PO TABS: 5.0000 mg | ORAL_TABLET | Freq: Once | ORAL | Status: DC | PRN

## 2023-06-03 MED ORDER — ACETAMINOPHEN 500 MG PO TABS
1000.0000 mg | ORAL_TABLET | Freq: Once | ORAL | Status: AC
  Administered 2023-06-03: 1000 mg via ORAL

## 2023-06-03 MED ORDER — OXYCODONE HCL 5 MG/5ML PO SOLN: 5.0000 mg | Freq: Once | ORAL | Status: DC | PRN

## 2023-06-03 MED ORDER — DEXAMETHASONE SODIUM PHOSPHATE 4 MG/ML IJ SOLN
INTRAMUSCULAR | Status: DC | PRN
  Administered 2023-06-03: 5 mg via INTRAVENOUS

## 2023-06-03 MED ORDER — FENTANYL CITRATE (PF) 100 MCG/2ML IJ SOLN
INTRAMUSCULAR | Status: AC
  Filled 2023-06-03: qty 2

## 2023-06-03 MED ORDER — VANCOMYCIN HCL 500 MG IV SOLR
INTRAVENOUS | Status: DC | PRN
  Administered 2023-06-03: 500 mg via TOPICAL

## 2023-06-03 MED ORDER — MIDAZOLAM HCL 2 MG/2ML IJ SOLN
2.0000 mg | Freq: Once | INTRAMUSCULAR | Status: AC
  Administered 2023-06-03: 2 mg via INTRAVENOUS

## 2023-06-03 MED ORDER — FENTANYL CITRATE (PF) 100 MCG/2ML IJ SOLN: 25.0000 ug | INTRAMUSCULAR | Status: DC | PRN

## 2023-06-03 MED ORDER — LIDOCAINE HCL (CARDIAC) PF 100 MG/5ML IV SOSY
PREFILLED_SYRINGE | INTRAVENOUS | Status: DC | PRN
  Administered 2023-06-03: 60 mg via INTRAVENOUS

## 2023-06-03 MED ORDER — FENTANYL CITRATE (PF) 100 MCG/2ML IJ SOLN
100.0000 ug | Freq: Once | INTRAMUSCULAR | Status: AC
  Administered 2023-06-03: 100 ug via INTRAVENOUS

## 2023-06-03 MED ORDER — CHLORHEXIDINE GLUCONATE 4 % EX SOLN: 60.0000 mL | Freq: Once | CUTANEOUS | Status: DC

## 2023-06-03 MED ORDER — KETOROLAC TROMETHAMINE 30 MG/ML IJ SOLN: 30.0000 mg | Freq: Once | INTRAMUSCULAR | Status: DC | PRN

## 2023-06-03 MED ORDER — PROPOFOL 10 MG/ML IV BOLUS
INTRAVENOUS | Status: DC | PRN
Start: 2023-06-03 — End: 2023-06-03
  Administered 2023-06-03: 200 mg via INTRAVENOUS

## 2023-06-03 MED ORDER — CEFAZOLIN SODIUM-DEXTROSE 2-4 GM/100ML-% IV SOLN
2.0000 g | INTRAVENOUS | Status: AC
  Administered 2023-06-03: 2 g via INTRAVENOUS

## 2023-06-03 MED ORDER — MIDAZOLAM HCL 2 MG/2ML IJ SOLN
INTRAMUSCULAR | Status: AC
  Filled 2023-06-03: qty 2

## 2023-06-03 MED ORDER — POVIDONE-IODINE 10 % EX SOLN
CUTANEOUS | Status: DC | PRN
  Administered 2023-06-03: 1 via TOPICAL

## 2023-06-03 MED ORDER — AMISULPRIDE (ANTIEMETIC) 5 MG/2ML IV SOLN: 10.0000 mg | Freq: Once | INTRAVENOUS | Status: DC | PRN

## 2023-06-03 MED ORDER — BUPIVACAINE HCL (PF) 0.25 % IJ SOLN
INTRAMUSCULAR | Status: DC | PRN
  Administered 2023-06-03: 15 mL via PERINEURAL

## 2023-06-03 MED ORDER — LACTATED RINGERS IV SOLN: INTRAVENOUS | Status: DC

## 2023-06-03 MED ORDER — BUPIVACAINE HCL (PF) 0.5 % IJ SOLN
INTRAMUSCULAR | Status: DC | PRN
Start: 2023-06-03 — End: 2023-06-03
  Administered 2023-06-03: 30 mL via PERINEURAL

## 2023-06-03 MED ORDER — PROMETHAZINE HCL 25 MG/ML IJ SOLN: 6.2500 mg | INTRAMUSCULAR | Status: DC | PRN

## 2023-06-03 MED ORDER — ACETAMINOPHEN 500 MG PO TABS
ORAL_TABLET | ORAL | Status: AC
  Filled 2023-06-03: qty 2

## 2023-06-03 MED ORDER — CEFAZOLIN SODIUM-DEXTROSE 2-4 GM/100ML-% IV SOLN
INTRAVENOUS | Status: AC
  Filled 2023-06-03: qty 100

## 2023-06-03 MED ORDER — 0.9 % SODIUM CHLORIDE (POUR BTL) OPTIME
TOPICAL | Status: DC | PRN
  Administered 2023-06-03: 1000 mL

## 2023-06-03 SURGICAL SUPPLY — 80 items
APL PRP STRL LF DISP 70% ISPRP (MISCELLANEOUS) ×2
BANDAGE ESMARK 6X9 LF (GAUZE/BANDAGES/DRESSINGS) ×1 IMPLANT
BIT DRILL 2.5X2.75 QC CALB (BIT) IMPLANT
BLADE SURG 15 STRL LF DISP TIS (BLADE) ×4 IMPLANT
BLADE SURG 15 STRL SS (BLADE) ×2
BNDG CMPR 5X4 CHSV STRCH STRL (GAUZE/BANDAGES/DRESSINGS) ×1
BNDG CMPR 5X4 KNIT ELC UNQ LF (GAUZE/BANDAGES/DRESSINGS)
BNDG CMPR 6 X 5 YARDS HK CLSR (GAUZE/BANDAGES/DRESSINGS)
BNDG CMPR 9X6 STRL LF SNTH (GAUZE/BANDAGES/DRESSINGS) ×1
BNDG CMPR MED 10X6 ELC LF (GAUZE/BANDAGES/DRESSINGS) ×1
BNDG COHESIVE 4X5 TAN STRL LF (GAUZE/BANDAGES/DRESSINGS) ×1 IMPLANT
BNDG ELASTIC 4INX 5YD STR LF (GAUZE/BANDAGES/DRESSINGS) IMPLANT
BNDG ELASTIC 6INX 5YD STR LF (GAUZE/BANDAGES/DRESSINGS) IMPLANT
BNDG ELASTIC 6X10 VLCR STRL LF (GAUZE/BANDAGES/DRESSINGS) ×1 IMPLANT
BNDG ESMARK 6X9 LF (GAUZE/BANDAGES/DRESSINGS) ×1
BNDG GAUZE DERMACEA FLUFF 4 (GAUZE/BANDAGES/DRESSINGS) ×1 IMPLANT
BNDG GZE DERMACEA 4 6PLY (GAUZE/BANDAGES/DRESSINGS) ×1
BRUSH SCRUB EZ 4% CHG (MISCELLANEOUS) ×1 IMPLANT
CANISTER SUCT 1200ML W/VALVE (MISCELLANEOUS) ×1 IMPLANT
CHLORAPREP W/TINT 26 (MISCELLANEOUS) ×1 IMPLANT
COVER BACK TABLE 60X90IN (DRAPES) ×1 IMPLANT
CUFF TOURN SGL QUICK 34 (TOURNIQUET CUFF) ×1
CUFF TRNQT CYL 34X4.125X (TOURNIQUET CUFF) ×1 IMPLANT
DRAPE C-ARM 42X72 X-RAY (DRAPES) ×1 IMPLANT
DRAPE C-ARMOR (DRAPES) ×1 IMPLANT
DRAPE EXTREMITY T 121X128X90 (DISPOSABLE) ×1 IMPLANT
DRAPE IMP U-DRAPE 54X76 (DRAPES) ×1 IMPLANT
DRAPE U-SHAPE 47X51 STRL (DRAPES) ×1 IMPLANT
DRSG MEPITEL 4X7.2 (GAUZE/BANDAGES/DRESSINGS) ×1 IMPLANT
ELECT REM PT RETURN 9FT ADLT (ELECTROSURGICAL) ×1
ELECTRODE REM PT RTRN 9FT ADLT (ELECTROSURGICAL) ×1 IMPLANT
FIXATION ZIPTIGHT ANKLE SNDSMS (Ankle) IMPLANT
GAUZE PAD ABD 8X10 STRL (GAUZE/BANDAGES/DRESSINGS) ×5 IMPLANT
GAUZE SPONGE 4X4 12PLY STRL (GAUZE/BANDAGES/DRESSINGS) ×1 IMPLANT
GLOVE BIOGEL PI IND STRL 8 (GLOVE) ×1 IMPLANT
GLOVE SURG SS PI 7.5 STRL IVOR (GLOVE) ×2 IMPLANT
GOWN STRL REUS W/ TWL LRG LVL3 (GOWN DISPOSABLE) ×2 IMPLANT
GOWN STRL REUS W/TWL LRG LVL3 (GOWN DISPOSABLE) ×2
K-WIRE ACE 1.6X6 (WIRE) ×2
KWIRE ACE 1.6X6 (WIRE) IMPLANT
MARKER SKIN DUAL TIP RULER LAB (MISCELLANEOUS) IMPLANT
NDL HYPO 25X1 1.5 SAFETY (NEEDLE) IMPLANT
NEEDLE HYPO 25X1 1.5 SAFETY (NEEDLE) IMPLANT
NS IRRIG 1000ML POUR BTL (IV SOLUTION) ×1 IMPLANT
PACK BASIN DAY SURGERY FS (CUSTOM PROCEDURE TRAY) ×1 IMPLANT
PAD CAST 4YDX4 CTTN HI CHSV (CAST SUPPLIES) ×1 IMPLANT
PADDING CAST ABS COTTON 4X4 ST (CAST SUPPLIES) IMPLANT
PADDING CAST COTTON 4X4 STRL (CAST SUPPLIES) ×1
PADDING CAST SYNTHETIC 4X4 STR (CAST SUPPLIES) ×2 IMPLANT
PADDING CAST SYNTHETIC 6X4 NS (CAST SUPPLIES) ×2 IMPLANT
PENCIL SMOKE EVACUATOR (MISCELLANEOUS) ×1 IMPLANT
PLATE LOCK 7H 92 BILAT FIB (Plate) IMPLANT
SCREW LOCK 3.5X14 DIST TIB (Screw) IMPLANT
SCREW LOCK CORT STAR 3.5X14 (Screw) IMPLANT
SCREW NON LOCKING LP 3.5 14MM (Screw) IMPLANT
SCREW NON LOCKING LP 3.5 16MM (Screw) IMPLANT
SHEET MEDIUM DRAPE 40X70 STRL (DRAPES) ×1 IMPLANT
SLEEVE SCD COMPRESS KNEE MED (STOCKING) ×1 IMPLANT
SPIKE FLUID TRANSFER (MISCELLANEOUS) IMPLANT
SPLINT PLASTER CAST FAST 5X30 (CAST SUPPLIES) ×20 IMPLANT
SPONGE T-LAP 18X18 ~~LOC~~+RFID (SPONGE) ×1 IMPLANT
STAPLER VISISTAT 35W (STAPLE) IMPLANT
STOCKINETTE 6 STRL (DRAPES) ×1 IMPLANT
STOCKINETTE ORTHO 6X25 (MISCELLANEOUS) ×1 IMPLANT
SUCTION TUBE FRAZIER 10FR DISP (SUCTIONS) IMPLANT
SUT ETHILON 2 0 FS 18 (SUTURE) ×2 IMPLANT
SUT MNCRL AB 3-0 PS2 18 (SUTURE) IMPLANT
SUT PDS 3-0 CT2 (SUTURE)
SUT PDS II 3-0 CT2 27 ABS (SUTURE) IMPLANT
SUT VIC AB 2-0 SH 27 (SUTURE) ×1
SUT VIC AB 2-0 SH 27XBRD (SUTURE) ×1 IMPLANT
SUT VIC AB 3-0 SH 27 (SUTURE)
SUT VIC AB 3-0 SH 27X BRD (SUTURE) IMPLANT
SUT VICRYL 0 SH 27 (SUTURE) IMPLANT
SYR BULB IRRIG 60ML STRL (SYRINGE) ×1 IMPLANT
SYR CONTROL 10ML LL (SYRINGE) IMPLANT
TOWEL GREEN STERILE FF (TOWEL DISPOSABLE) ×2 IMPLANT
TUBE CONNECTING 20X1/4 (TUBING) ×1 IMPLANT
UNDERPAD 30X36 HEAVY ABSORB (UNDERPADS AND DIAPERS) ×1 IMPLANT
ZIPTIGHT ANKLE SYNODESMOSS FIX (Ankle) ×1 IMPLANT

## 2023-06-03 NOTE — Anesthesia Preprocedure Evaluation (Addendum)
Anesthesia Evaluation  Patient identified by MRN, date of birth, ID band Patient awake    Reviewed: Allergy & Precautions, NPO status , Patient's Chart, lab work & pertinent test results  History of Anesthesia Complications (+) PONV and history of anesthetic complications  Airway Mallampati: II  TM Distance: >3 FB Neck ROM: Full    Dental no notable dental hx.    Pulmonary former smoker   Pulmonary exam normal        Cardiovascular negative cardio ROS Normal cardiovascular exam     Neuro/Psych  Headaches PSYCHIATRIC DISORDERS       Neuromuscular disease    GI/Hepatic Neg liver ROS, hiatal hernia,GERD  Medicated and Controlled,,  Endo/Other  negative endocrine ROS    Renal/GU negative Renal ROS     Musculoskeletal   Abdominal  (+) + obese  Peds  (+) ADHD Hematology negative hematology ROS (+)   Anesthesia Other Findings Closed fracture of lateral malleolus of left fibula  Reproductive/Obstetrics                             Anesthesia Physical Anesthesia Plan  ASA: 2  Anesthesia Plan: Regional and General   Post-op Pain Management: Regional block*   Induction: Intravenous  PONV Risk Score and Plan: 4 or greater and Ondansetron, Dexamethasone, Midazolam, Treatment may vary due to age or medical condition and Propofol infusion  Airway Management Planned: LMA  Additional Equipment:   Intra-op Plan:   Post-operative Plan: Extubation in OR  Informed Consent: I have reviewed the patients History and Physical, chart, labs and discussed the procedure including the risks, benefits and alternatives for the proposed anesthesia with the patient or authorized representative who has indicated his/her understanding and acceptance.     Dental advisory given  Plan Discussed with: CRNA  Anesthesia Plan Comments:        Anesthesia Quick Evaluation

## 2023-06-03 NOTE — Op Note (Addendum)
06/03/2023  2:47 PM   PATIENT: Rachel Woods  57 y.o. female  MRN: [id number]   PRE-OPERATIVE DIAGNOSIS:   Closed fracture of lateral malleolus of left fibula   POST-OPERATIVE DIAGNOSIS:   Same   PROCEDURE: 1] Open reduction internal fixation of left lateral malleolar ankle fracture 2] Open reduction internal fixation of left ankle syndesmosis   SURGEON:  Netta Cedars, MD   ASSISTANT: None   ANESTHESIA: General, regional   EBL: Minimal   TOURNIQUET:   * Missing tourniquet times found for documented tourniquets in log: [id number] *   COMPLICATIONS: None apparent   DISPOSITION: Extubated, awake and stable to recovery.   INDICATION FOR PROCEDURE: The patient presented with above diagnosis.  We discussed the diagnosis, alternative treatment options, risks and benefits of the above surgical intervention, as well as alternative non-operative treatments. All questions/concerns were addressed and the patient/family demonstrated appropriate understanding of the diagnosis, the procedure, the postoperative course, and overall prognosis. The patient wished to proceed with surgical intervention and signed an informed surgical consent as such, in each others presence prior to surgery.   PROCEDURE IN DETAIL: After preoperative consent was obtained and the correct operative site was identified, the patient was brought to the operating room supine on stretcher and transferred onto operating table. General anesthesia was induced. Preoperative antibiotics were administered. Surgical timeout was taken. The patient was then positioned supine with an ipsilateral hip bump. The operative lower extremity was prepped and draped in standard sterile fashion with a tourniquet around the thigh. The extremity was exsanguinated and the tourniquet was inflated to 275 mmHg.  A standard lateral incision was made over the distal fibula. Dissection was carried down to the level of the fibula and  the fracture site identified. The superficial peroneal nerve was identified and protected throughout the procedure. The fibula was noted to be shortened with interposed periosteum. The fibula was brought out to length. The fibula fracture was debrided and the edges defined to achieve cortical read. Reduction maneuver was performed using pointed reduction forceps and lobster forceps. In this manner, the fibula length was restored and fracture reduced. A lag screw was not placed given the orientation of fracture lines and comminution. Due to poor bone quality and extensive comminution at the fracture site, it was decided to use a locking distal fibula plate. We then selected a Zimmer locking plate to match the anatomy of the distal fibula and placed it laterally. This was implanted under intraoperative fluoroscopy with a combination of distal locking screws and proximal cortical & locking screws.  A manual external rotation stress radiograph was obtained and demonstrated widening of the ankle mortise. Given this intraoperative finding as well as preoperative subluxation, it was decided to reduce and fix the syndesmosis. Therefore a suture fixation system (ZipTight device) was implanted through the fibula plate in cannulated fashion to fix the syndesmosis. Anchor/button position was verified along anteromedial tibial cortex by fluoroscopy. A repeat stress radiograph showed complete stability of the ankle mortise to testing.  The surgical sites were thoroughly irrigated. The tourniquet was deflated and hemostasis achieved. Betadine and vancomycin powder were applied. The deep layers were closed using 2-0 vicryl. The skin was closed without tension.    The leg was cleaned with saline and sterile dressings with gauze were applied. A well padded bulky short leg splint was applied. The patient was awakened from anesthesia and transported to the recovery room in stable condition.    FOLLOW UP PLAN: -transfer to  PACU, then home -strict NWB operative extremity, maximum elevation -maintain short leg splint until follow up -DVT ppx: Aspirin 81 mg twice daily while NWB -follow up as outpatient within 7-10 days for wound check with exchange of short leg splint to short leg cast -sutures out in 2-3 weeks in outpatient office   RADIOGRAPHS: AP, lateral, oblique and stress radiographs of the left ankle were obtained intraoperatively. These showed interval reduction and fixation of the fractures. Manual stress radiographs were taken and the ankle mortise and tibiofibular relationship were noted to be stable following fixation. All hardware is appropriately positioned and of the appropriate lengths. No other acute injuries are noted.   Netta Cedars Orthopa[facility]

## 2023-06-03 NOTE — H&P (Signed)

## 2023-06-03 NOTE — Anesthesia Procedure Notes (Signed)
Procedure Name: LMA Insertion Date/Time: 06/03/2023 3:01 PM  Performed by: Ronnette Hila, CRNAPre-anesthesia Checklist: Patient identified, Emergency Drugs available, Suction available and Patient being monitored Patient Re-evaluated:Patient Re-evaluated prior to induction Oxygen Delivery Method: Circle System Utilized Preoxygenation: Pre-oxygenation with 100% oxygen Induction Type: IV induction Ventilation: Mask ventilation without difficulty LMA: LMA inserted LMA Size: 4.0 Number of attempts: 1 Airway Equipment and Method: bite block Placement Confirmation: positive ETCO2 Tube secured with: Tape Dental Injury: Teeth and Oropharynx as per pre-operative assessment

## 2023-06-03 NOTE — Discharge Instructions (Addendum)
Rachel Cedars, MD [facility]  Please read the following information regarding your care after surgery.  Medications  You only need a prescription for the narcotic pain medicine (ex. oxycodone, Percocet, Norco).  All of the other medicines listed below are available over the counter. ? Aleve 2 pills twice a day for the first 3 days after surgery. ? acetominophen (Tylenol) 650 mg every 4-6 hours as you need for minor to moderate pain ? oxycodone as prescribed for severe pain  ? To help prevent blood clots, take aspirin (81 mg) twice daily for 42 days after surgery (or total duration of nonweightbearing).  You should also get up every hour while you are awake to move around.  Weight Bearing ? Do NOT bear any weight on the operated leg or foot. This means do NOT touch your surgical leg to the ground!  Cast / Splint / Dressing ? If you have a splint, do NOT remove this. Keep your splint, cast or dressing clean and dry.  Don't put anything (coat hanger, pencil, etc) down inside of it.  If it gets wet, call the office immediately to schedule an appointment for a cast change.  Swelling IMPORTANT: It is normal for you to have swelling where you had surgery. To reduce swelling and pain, keep at least 3 pillows under your leg so that your toes are above your nose and your heel is above the level of your hip.  It may be necessary to keep your foot or leg elevated for several weeks.  This is critical to helping your incisions heal and your pain to feel better.  Follow Up Call my office at [phone] when you are discharged from the hospital or surgery center to schedule an appointment to be seen 7-10 days after surgery.  Call my office at [phone] if you develop a fever >101.5 F, nausea, vomiting, bleeding from the surgical site or severe pain.     Post Anesthesia Home Care Instructions  Activity: Get plenty of rest for the remainder of the day. A responsible individual must stay with  you for 24 hours following the procedure.  For the next 24 hours, DO NOT: -Drive a car -Advertising copywriter -Drink alcoholic beverages -Take any medication unless instructed by your physician -Make any legal decisions or sign important papers.  Meals: Start with liquid foods such as gelatin or soup. Progress to regular foods as tolerated. Avoid greasy, spicy, heavy foods. If nausea and/or vomiting occur, drink only clear liquids until the nausea and/or vomiting subsides. Call your physician if vomiting continues.  Special Instructions/Symptoms: Your throat may feel dry or sore from the anesthesia or the breathing tube placed in your throat during surgery. If this causes discomfort, gargle with warm salt water. The discomfort should disappear within 24 hours.  If you had a scopolamine patch placed behind your ear for the management of post- operative nausea and/or vomiting:  1. The medication in the patch is effective for 72 hours, after which it should be removed.  Wrap patch in a tissue and discard in the trash. Wash hands thoroughly with soap and water. 2. You may remove the patch earlier than 72 hours if you experience unpleasant side effects which may include dry mouth, dizziness or visual disturbances. 3. Avoid touching the patch. Wash your hands with soap and water after contact with the patch.    Regional Anesthesia Blocks  1. Numbness or the inability to move the "blocked" extremity may last from 3-48 hours after placement. The  length of time depends on the medication injected and your individual response to the medication. If the numbness is not going away after 48 hours, call your surgeon.  2. The extremity that is blocked will need to be protected until the numbness is gone and the  Strength has returned. Because you cannot feel it, you will need to take extra care to avoid injury. Because it may be weak, you may have difficulty moving it or using it. You may not know what position  it is in without looking at it while the block is in effect.  3. For blocks in the legs and feet, returning to weight bearing and walking needs to be done carefully. You will need to wait until the numbness is entirely gone and the strength has returned. You should be able to move your leg and foot normally before you try and bear weight or walk. You will need someone to be with you when you first try to ensure you do not fall and possibly risk injury.  4. Bruising and tenderness at the needle site are common side effects and will resolve in a few days.  5. Persistent numbness or new problems with movement should be communicated to the surgeon or the [facility] [phone] [facility] [phone]).  Discharge Instructions After Orthopedic Procedures:  *You may feel tired and weak following your procedure. It is recommended that you limit physical activity for the next 24 hours and rest at home for the remainder of today and tomorrow. *No strenuous activity should be started without your doctor's permission.  Elevate the extremity that you had surgery on to a level above your heart. This should continue for 48 hours or as instructed by your doctor.  If you had hand, arm or shoulder surgery you should move your fingers frequently unless otherwise instructed by your doctor.  If you had foot, knee or leg surgery you should wiggle your toes frequently unless otherwise instructed by your doctor.  Follow your doctor's exact instructions for activity at home. Use your home equipment as instructed. (Crutches, hard shoes, slings etc.)  Limit your activity as instructed by your doctor.  Report to your doctor should any of the following occur: 1. Extreme swelling of your fingers or toes. 2. Inability to wiggle your fingers or toes. 3. Coldness, pale or bluish color in your fingers or toes. 4. Loss of sensation, numbness or tingling of your fingers or toes. 5. Unusual  smell or odor from under your dressing or cast. 6. Excessive bleeding or drainage from the surgical site. 7. Pain not relieved by medication your doctor has prescribed for you. 8. Cast or dressing too tight (do not get your dressing or cast wet or put anything under          your dressing or cast.)  *Do not change your dressing unless instructed by your doctor or discharge nurse. Then follow exact instructions.  *Follow labeled instructions for any medications that your doctor may have prescribed for you. *Should any questions or complications develop following your procedure, PLEASE CONTACT YOUR DOCTOR.           Next dose of tylenol available at 7pm

## 2023-06-03 NOTE — Anesthesia Procedure Notes (Signed)
Anesthesia Regional Block: Adductor canal block   Pre-Anesthetic Checklist: , timeout performed,  Correct Patient, Correct Site, Correct Laterality,  Correct Procedure,, site marked,  Risks and benefits discussed,  Surgical consent,  Pre-op evaluation,  At surgeon's request and post-op pain management  Laterality: Left  Prep: chloraprep       Needles:  Injection technique: Single-shot  Needle Type: Echogenic Stimulator Needle     Needle Length: 10cm  Needle Gauge: 20     Additional Needles:   Procedures:,,,, ultrasound used (permanent image in chart),,    Narrative:  Start time: 06/03/2023 2:00 PM End time: 06/03/2023 2:10 PM Injection made incrementally with aspirations every 5 mL.  Performed by: Personally  Anesthesiologist: Leonides Grills, MD  Additional Notes: Functioning IV was confirmed and monitors were applied. A time-out was performed. Hand hygiene and sterile gloves were used. The thigh was placed in a frog-leg position and prepped in a sterile fashion. A 20ga Bbraun echogenic stimulator needle was placed using ultrasound guidance.  Negative aspiration and negative test dose prior to incremental administration of local anesthetic. The patient tolerated the procedure well.

## 2023-06-03 NOTE — H&P (Signed)
ORTHOPAEDIC SURGERY H&P  Subjective:  The patient presents with left ankle fx.   Past Medical History:  Diagnosis Date   ADHD    Closed left ankle fracture 05/09/2023   Diverticulosis 2018   Family history of adverse reaction to anesthesia    05-22-2023  per pt in 1970's mother had adverse reaction to anesthesia possibly dring surgery , almost died,  pt does not know the details ;   stated mother survived , no residuals,  mother other surgery later with no issues   GERD (gastroesophageal reflux disease)    Hiatal hernia    History of uterine fibroid    Hyperlipidemia    Migraines    PONV (postoperative nausea and vomiting)    Wears glasses     Past Surgical History:  Procedure Laterality Date   COLONOSCOPY WITH ESOPHAGOGASTRODUODENOSCOPY (EGD)  07/15/2017   dr Loreta Ave   CYSTOSCOPY N/A 11/17/2017   Procedure: CYSTOSCOPY;  Surgeon: Jerene Bears, MD;  Location: [facility];  Service: Gynecology;  Laterality: N/A;   SALPINGOOPHORECTOMY Right 11/17/2017   Procedure: RIGHT OOPHORECTOMY;  Surgeon: Jerene Bears, MD;  Location: [facility];  Service: Gynecology;  Laterality: Right;   TOTAL LAPAROSCOPIC HYSTERECTOMY WITH SALPINGECTOMY Bilateral 11/17/2017   Procedure: TOTAL LAPAROSCOPIC HYSTERECTOMY WITH SALPINGECTOMY WITH CAUTERIZATION OF ENDOMETRIOSIS;  Surgeon: Jerene Bears, MD;  Location: [facility];  Service: Gynecology;  Laterality: Bilateral;  possible BSO/ large uterus/ need 5 hours OR time   WISDOM TOOTH EXTRACTION       (Not in an outpatient encounter)    No Known Allergies  Social History   Socioeconomic History   Marital status: Divorced    Spouse name: Not on file   Number of children: 1   Years of education: Not on file   Highest education level: Not on file  Occupational History   Occupation: Teacher    Comment: [employer]  Tobacco Use   Smoking status: Former    Current packs/day: 0.00    Types: Cigarettes    Quit date: 1999    Years since quitting: 25.6    Smokeless tobacco: Never   Tobacco comments:    05-22-2023  per pt quit smoking 1999 , smokes for two yrs  Vaping Use   Vaping status: Never Used  Substance and Sexual Activity   Alcohol use: Not Currently   Drug use: Never   Sexual activity: Yes    Partners: Male    Birth control/protection: Surgical    Comment: hysterectomy  Other Topics Concern   Not on file  Social History Narrative   Not on file   Social Determinants of Health   Financial Resource Strain: Not on file  Food Insecurity: Not on file  Transportation Needs: Not on file  Physical Activity: Not on file  Stress: Not on file  Social Connections: Not on file  Intimate Partner Violence: Not on file     History reviewed. No pertinent family history.   Review of Systems Pertinent items are noted in HPI.  Objective: Vital signs in last 24 hours:    05/25/2023    8:37 AM 05/25/2023    8:35 AM 05/22/2023    4:01 PM  Vitals with BMI  Height  5\' 3"  5\' 4"   Weight  175 lbs 175 lbs  BMI  31.01 30.02  Systolic 125    Diastolic 72    Pulse 93        EXAM: General: Well nourished, well developed. Awake, alert and  oriented to time, place, person. Normal mood and affect. No apparent distress. Breathing room air.  Operative Lower Extremity: Alignment - Neutral Deformity - None Skin intact Tenderness to palpation - left ankle 5/5 TA, PT, GS, Per, EHL, FHL Sensation intact to light touch throughout Palpable DP and PT pulses Special testing: None  The contralateral foot/ankle was examined for comparison and noted to be neurovascularly intact with no localized deformity, swelling, or tenderness.  Imaging Review All images taken were independently reviewed by me.  Assessment/Plan: The clinical and radiographic findings were reviewed and discussed at length with the patient.  The patient has left ankle fx.  We spoke at length about the natural course of these findings. We discussed nonoperative and  operative treatment options in detail.  The risks and benefits were presented and reviewed. The risks due to implant failure/irritation, infection, stiffness, nerve/vessel/tendon injury, wound healing issues, failure of this surgery, need for further surgery, thromboembolic events, amputation, death among others were discussed. The patient acknowledged the explanation and agreed to proceed with the plan.  Rachel Woods  Orthopaedic Surgery [facility]

## 2023-06-03 NOTE — Anesthesia Procedure Notes (Signed)
Anesthesia Regional Block: Popliteal block   Pre-Anesthetic Checklist: , timeout performed,  Correct Patient, Correct Site, Correct Laterality,  Correct Procedure, Correct Position, site marked,  Risks and benefits discussed,  Surgical consent,  Pre-op evaluation,  At surgeon's request and post-op pain management  Laterality: Left  Prep: chloraprep       Needles:  Injection technique: Single-shot  Needle Type: Echogenic Stimulator Needle     Needle Length: 10cm  Needle Gauge: 20     Additional Needles:   Procedures:,,,, ultrasound used (permanent image in chart),,     Nerve Stimulator or Paresthesia:  Response: Foot dorsiflexion  Additional Responses:   Narrative:  Start time: 06/03/2023 1:50 PM End time: 06/03/2023 2:00 PM Injection made incrementally with aspirations every 5 mL.  Performed by: Personally  Anesthesiologist: Leonides Grills, MD  Additional Notes: Functioning IV was confirmed and monitors were applied.  A timeout was performed. Sterile prep, hand hygiene and sterile gloves were used. A 20ga Bbraun echogenic stimulator needle was used. Negative aspiration and negative test dose prior to incremental administration of local anesthetic. The patient tolerated the procedure well.  Ultrasound guidance: relevent anatomy identified, needle position confirmed, local anesthetic spread visualized around nerve(s), vascular puncture avoided.  Image printed for medical record.

## 2023-06-03 NOTE — Transfer of Care (Signed)
Immediate Anesthesia Transfer of Care Note  Patient: Rachel Woods  Procedure(s) Performed: OPEN REDUCTION INTERNAL FIXATION (ORIF) lateral malleolus ANKLE FRACTURE (Left: Ankle)  Patient Location: PACU  Anesthesia Type:GA combined with regional for post-op pain  Level of Consciousness: awake, drowsy, and patient cooperative  Airway & Oxygen Therapy: Patient Spontanous Breathing and Patient connected to face mask oxygen  Post-op Assessment: Report given to RN and Post -op Vital signs reviewed and stable  Post vital signs: Reviewed and stable  Last Vitals:  Vitals Value Taken Time  BP    Temp    Pulse 74 06/03/23 1550  Resp    SpO2 90 % 06/03/23 1550  Vitals shown include unfiled device data.  Last Pain:  Vitals:   06/03/23 1030  TempSrc: Temporal  PainSc: 1       Patients Stated Pain Goal: 6 (06/03/23 1030)  Complications: No notable events documented.

## 2023-06-04 ENCOUNTER — Encounter (HOSPITAL_BASED_OUTPATIENT_CLINIC_OR_DEPARTMENT_OTHER): Payer: Self-pay | Admitting: Orthopaedic Surgery

## 2023-06-04 NOTE — Anesthesia Postprocedure Evaluation (Signed)
Anesthesia Post Note  Patient: Rachel Woods  Procedure(s) Performed: OPEN REDUCTION INTERNAL FIXATION (ORIF) lateral malleolus ANKLE FRACTURE (Left: Ankle)     Patient location during evaluation: PACU Anesthesia Type: Regional and General Level of consciousness: awake Pain management: pain level controlled Vital Signs Assessment: post-procedure vital signs reviewed and stable Respiratory status: spontaneous breathing, nonlabored ventilation and respiratory function stable Cardiovascular status: blood pressure returned to baseline and stable Postop Assessment: no apparent nausea or vomiting Anesthetic complications: no   No notable events documented.  Last Vitals:  Vitals:   06/03/23 1630 06/03/23 1651  BP: (!) 120/56 (!) 115/58  Pulse: (!) 57 61  Resp: 11   Temp:  (!) 36.3 C  SpO2: 95% 96%    Last Pain:  Vitals:   06/03/23 1651  TempSrc: Oral  PainSc: 0-No pain                 Winston Misner P Rumi Taras
